# Patient Record
Sex: Female | Born: 1960
Health system: Southern US, Community
[De-identification: ages and names within clinical notes are randomized; demographics above are authoritative.]

## PROBLEM LIST (undated history)

## (undated) DIAGNOSIS — R109 Unspecified abdominal pain: Secondary | ICD-10-CM

## (undated) DIAGNOSIS — C801 Malignant (primary) neoplasm, unspecified: Secondary | ICD-10-CM

## (undated) DIAGNOSIS — Z9889 Other specified postprocedural states: Secondary | ICD-10-CM

## (undated) DIAGNOSIS — Z923 Personal history of irradiation: Secondary | ICD-10-CM

## (undated) DIAGNOSIS — Z9049 Acquired absence of other specified parts of digestive tract: Secondary | ICD-10-CM

## (undated) DIAGNOSIS — C50919 Malignant neoplasm of unspecified site of unspecified female breast: Secondary | ICD-10-CM

## (undated) DIAGNOSIS — C9210 Chronic myeloid leukemia, BCR/ABL-positive, not having achieved remission: Secondary | ICD-10-CM

## (undated) DIAGNOSIS — C189 Malignant neoplasm of colon, unspecified: Secondary | ICD-10-CM

## (undated) HISTORY — DX: Acquired absence of other specified parts of digestive tract: Z90.49

## (undated) HISTORY — DX: Chronic myeloid leukemia, BCR/ABL-positive, not having achieved remission: C92.10

## (undated) HISTORY — PX: SKIN CANCER EXCISION: SHX779

## (undated) HISTORY — DX: Malignant neoplasm of colon, unspecified: C18.9

## (undated) HISTORY — DX: Other specified postprocedural states: Z98.890

## (undated) HISTORY — DX: Malignant neoplasm of unspecified site of unspecified female breast: C50.919

---

## 1998-09-25 ENCOUNTER — Other Ambulatory Visit: Admission: RE | Admit: 1998-09-25 | Discharge: 1998-09-25 | Payer: Self-pay | Admitting: Gynecology

## 1998-11-08 ENCOUNTER — Encounter: Payer: Self-pay | Admitting: Gynecology

## 1998-11-08 ENCOUNTER — Ambulatory Visit (HOSPITAL_COMMUNITY): Admission: RE | Admit: 1998-11-08 | Discharge: 1998-11-08 | Payer: Self-pay | Admitting: Gynecology

## 1999-09-26 ENCOUNTER — Other Ambulatory Visit: Admission: RE | Admit: 1999-09-26 | Discharge: 1999-09-26 | Payer: Self-pay | Admitting: Gynecology

## 2000-04-14 ENCOUNTER — Other Ambulatory Visit: Admission: RE | Admit: 2000-04-14 | Discharge: 2000-04-14 | Payer: Self-pay | Admitting: Gynecology

## 2000-09-17 ENCOUNTER — Encounter: Admission: RE | Admit: 2000-09-17 | Discharge: 2000-12-16 | Payer: Self-pay | Admitting: Gynecology

## 2000-10-26 ENCOUNTER — Ambulatory Visit (HOSPITAL_COMMUNITY): Admission: RE | Admit: 2000-10-26 | Discharge: 2000-10-26 | Payer: Self-pay | Admitting: Gynecology

## 2000-10-26 ENCOUNTER — Encounter: Payer: Self-pay | Admitting: Gynecology

## 2000-10-29 ENCOUNTER — Inpatient Hospital Stay (HOSPITAL_COMMUNITY): Admission: AD | Admit: 2000-10-29 | Discharge: 2000-11-01 | Payer: Self-pay | Admitting: Gynecology

## 2000-11-02 ENCOUNTER — Encounter: Admission: RE | Admit: 2000-11-02 | Discharge: 2000-12-01 | Payer: Self-pay | Admitting: Gynecology

## 2000-12-13 ENCOUNTER — Other Ambulatory Visit: Admission: RE | Admit: 2000-12-13 | Discharge: 2000-12-13 | Payer: Self-pay | Admitting: Gynecology

## 2002-02-07 ENCOUNTER — Other Ambulatory Visit: Admission: RE | Admit: 2002-02-07 | Discharge: 2002-02-07 | Payer: Self-pay | Admitting: Gynecology

## 2003-02-01 ENCOUNTER — Other Ambulatory Visit: Admission: RE | Admit: 2003-02-01 | Discharge: 2003-02-01 | Payer: Self-pay | Admitting: Gynecology

## 2003-12-01 DIAGNOSIS — Z9889 Other specified postprocedural states: Secondary | ICD-10-CM

## 2003-12-01 HISTORY — PX: BREAST LUMPECTOMY: SHX2

## 2003-12-01 HISTORY — DX: Other specified postprocedural states: Z98.890

## 2004-01-30 ENCOUNTER — Ambulatory Visit (HOSPITAL_COMMUNITY): Admission: RE | Admit: 2004-01-30 | Discharge: 2004-01-30 | Payer: Self-pay | Admitting: Gynecology

## 2004-02-04 ENCOUNTER — Other Ambulatory Visit: Admission: RE | Admit: 2004-02-04 | Discharge: 2004-02-04 | Payer: Self-pay | Admitting: Gynecology

## 2004-03-03 ENCOUNTER — Encounter: Admission: RE | Admit: 2004-03-03 | Discharge: 2004-03-03 | Payer: Self-pay | Admitting: Gynecology

## 2004-09-19 ENCOUNTER — Encounter: Admission: RE | Admit: 2004-09-19 | Discharge: 2004-09-19 | Payer: Self-pay | Admitting: Gynecology

## 2004-09-19 ENCOUNTER — Encounter (INDEPENDENT_AMBULATORY_CARE_PROVIDER_SITE_OTHER): Payer: Self-pay | Admitting: *Deleted

## 2004-09-26 ENCOUNTER — Encounter: Admission: RE | Admit: 2004-09-26 | Discharge: 2004-09-26 | Payer: Self-pay | Admitting: General Surgery

## 2004-10-13 ENCOUNTER — Encounter: Admission: RE | Admit: 2004-10-13 | Discharge: 2004-10-13 | Payer: Self-pay | Admitting: General Surgery

## 2004-10-13 ENCOUNTER — Ambulatory Visit (HOSPITAL_COMMUNITY): Admission: RE | Admit: 2004-10-13 | Discharge: 2004-10-13 | Payer: Self-pay | Admitting: General Surgery

## 2004-10-13 ENCOUNTER — Encounter (INDEPENDENT_AMBULATORY_CARE_PROVIDER_SITE_OTHER): Payer: Self-pay | Admitting: *Deleted

## 2004-10-13 ENCOUNTER — Ambulatory Visit (HOSPITAL_BASED_OUTPATIENT_CLINIC_OR_DEPARTMENT_OTHER): Admission: RE | Admit: 2004-10-13 | Discharge: 2004-10-13 | Payer: Self-pay | Admitting: General Surgery

## 2004-10-13 DIAGNOSIS — C50919 Malignant neoplasm of unspecified site of unspecified female breast: Secondary | ICD-10-CM

## 2004-10-13 HISTORY — DX: Malignant neoplasm of unspecified site of unspecified female breast: C50.919

## 2004-10-29 ENCOUNTER — Ambulatory Visit: Payer: Self-pay | Admitting: Oncology

## 2004-10-30 HISTORY — PX: RE-EXCISION OF BREAST LUMPECTOMY: SHX6048

## 2004-11-03 ENCOUNTER — Ambulatory Visit (HOSPITAL_BASED_OUTPATIENT_CLINIC_OR_DEPARTMENT_OTHER): Admission: RE | Admit: 2004-11-03 | Discharge: 2004-11-03 | Payer: Self-pay | Admitting: General Surgery

## 2004-11-03 ENCOUNTER — Encounter (INDEPENDENT_AMBULATORY_CARE_PROVIDER_SITE_OTHER): Payer: Self-pay | Admitting: Specialist

## 2004-11-11 ENCOUNTER — Ambulatory Visit: Admission: RE | Admit: 2004-11-11 | Discharge: 2005-01-28 | Payer: Self-pay | Admitting: Radiation Oncology

## 2004-11-27 ENCOUNTER — Ambulatory Visit (HOSPITAL_COMMUNITY): Admission: RE | Admit: 2004-11-27 | Discharge: 2004-11-27 | Payer: Self-pay | Admitting: General Surgery

## 2004-11-27 ENCOUNTER — Ambulatory Visit (HOSPITAL_BASED_OUTPATIENT_CLINIC_OR_DEPARTMENT_OTHER): Admission: RE | Admit: 2004-11-27 | Discharge: 2004-11-27 | Payer: Self-pay | Admitting: General Surgery

## 2004-11-27 ENCOUNTER — Encounter (INDEPENDENT_AMBULATORY_CARE_PROVIDER_SITE_OTHER): Payer: Self-pay | Admitting: *Deleted

## 2005-01-14 ENCOUNTER — Ambulatory Visit: Payer: Self-pay | Admitting: Oncology

## 2005-02-06 ENCOUNTER — Ambulatory Visit (HOSPITAL_BASED_OUTPATIENT_CLINIC_OR_DEPARTMENT_OTHER): Admission: RE | Admit: 2005-02-06 | Discharge: 2005-02-06 | Payer: Self-pay | Admitting: Gynecology

## 2005-02-19 ENCOUNTER — Ambulatory Visit: Admission: RE | Admit: 2005-02-19 | Discharge: 2005-02-19 | Payer: Self-pay | Admitting: Radiation Oncology

## 2005-03-05 ENCOUNTER — Other Ambulatory Visit: Admission: RE | Admit: 2005-03-05 | Discharge: 2005-03-05 | Payer: Self-pay | Admitting: Gynecology

## 2005-03-17 ENCOUNTER — Ambulatory Visit: Payer: Self-pay | Admitting: Oncology

## 2005-05-06 ENCOUNTER — Ambulatory Visit: Payer: Self-pay | Admitting: Oncology

## 2005-07-22 ENCOUNTER — Encounter: Admission: RE | Admit: 2005-07-22 | Discharge: 2005-07-22 | Payer: Self-pay | Admitting: Radiation Oncology

## 2005-09-28 ENCOUNTER — Encounter: Admission: RE | Admit: 2005-09-28 | Discharge: 2005-09-28 | Payer: Self-pay | Admitting: Radiation Oncology

## 2005-11-17 ENCOUNTER — Ambulatory Visit: Payer: Self-pay | Admitting: Oncology

## 2005-11-30 DIAGNOSIS — Z9049 Acquired absence of other specified parts of digestive tract: Secondary | ICD-10-CM

## 2005-11-30 HISTORY — DX: Acquired absence of other specified parts of digestive tract: Z90.49

## 2006-03-16 ENCOUNTER — Other Ambulatory Visit: Admission: RE | Admit: 2006-03-16 | Discharge: 2006-03-16 | Payer: Self-pay | Admitting: Gynecology

## 2006-05-17 ENCOUNTER — Ambulatory Visit: Payer: Self-pay | Admitting: Oncology

## 2006-05-19 LAB — COMPREHENSIVE METABOLIC PANEL
ALT: 12 U/L (ref 0–40)
BUN: 17 mg/dL (ref 6–23)
CO2: 26 mEq/L (ref 19–32)
Calcium: 8.7 mg/dL (ref 8.4–10.5)
Chloride: 106 mEq/L (ref 96–112)
Creatinine, Ser: 0.7 mg/dL (ref 0.40–1.20)
Glucose, Bld: 90 mg/dL (ref 70–99)

## 2006-05-19 LAB — CBC WITH DIFFERENTIAL/PLATELET
BASO%: 0.4 % (ref 0.0–2.0)
Basophils Absolute: 0 10*3/uL (ref 0.0–0.1)
Eosinophils Absolute: 0.1 10*3/uL (ref 0.0–0.5)
HCT: 35.9 % (ref 34.8–46.6)
HGB: 12.3 g/dL (ref 11.6–15.9)
MONO#: 0.5 10*3/uL (ref 0.1–0.9)
NEUT#: 4.2 10*3/uL (ref 1.5–6.5)
NEUT%: 64.4 % (ref 39.6–76.8)
Platelets: 263 10*3/uL (ref 145–400)
WBC: 6.6 10*3/uL (ref 3.9–10.0)
lymph#: 1.7 10*3/uL (ref 0.9–3.3)

## 2006-05-19 LAB — LACTATE DEHYDROGENASE: LDH: 166 U/L (ref 94–250)

## 2006-07-19 ENCOUNTER — Encounter (INDEPENDENT_AMBULATORY_CARE_PROVIDER_SITE_OTHER): Payer: Self-pay | Admitting: Specialist

## 2006-07-19 ENCOUNTER — Inpatient Hospital Stay (HOSPITAL_COMMUNITY): Admission: RE | Admit: 2006-07-19 | Discharge: 2006-07-26 | Payer: Self-pay | Admitting: General Surgery

## 2006-07-19 DIAGNOSIS — C189 Malignant neoplasm of colon, unspecified: Secondary | ICD-10-CM

## 2006-07-19 HISTORY — PX: LOW ANTERIOR BOWEL RESECTION: SUR1240

## 2006-07-19 HISTORY — DX: Malignant neoplasm of colon, unspecified: C18.9

## 2006-08-04 ENCOUNTER — Emergency Department (HOSPITAL_COMMUNITY): Admission: EM | Admit: 2006-08-04 | Discharge: 2006-08-04 | Payer: Self-pay | Admitting: Emergency Medicine

## 2006-08-05 ENCOUNTER — Ambulatory Visit: Payer: Self-pay | Admitting: Oncology

## 2006-08-17 LAB — CEA: CEA: 2.7 ng/mL (ref 0.0–5.0)

## 2006-08-17 LAB — CBC WITH DIFFERENTIAL/PLATELET
Basophils Absolute: 0 10*3/uL (ref 0.0–0.1)
Eosinophils Absolute: 0.1 10*3/uL (ref 0.0–0.5)
HGB: 11.2 g/dL — ABNORMAL LOW (ref 11.6–15.9)
LYMPH%: 19.8 % (ref 14.0–48.0)
MCV: 87.2 fL (ref 81.0–101.0)
MONO#: 0.4 10*3/uL (ref 0.1–0.9)
MONO%: 6.1 % (ref 0.0–13.0)
NEUT#: 4.5 10*3/uL (ref 1.5–6.5)
Platelets: 291 10*3/uL (ref 145–400)

## 2006-08-17 LAB — LACTATE DEHYDROGENASE: LDH: 128 U/L (ref 94–250)

## 2006-08-17 LAB — COMPREHENSIVE METABOLIC PANEL
CO2: 24 mEq/L (ref 19–32)
Calcium: 9.1 mg/dL (ref 8.4–10.5)
Chloride: 106 mEq/L (ref 96–112)
Creatinine, Ser: 0.67 mg/dL (ref 0.40–1.20)
Glucose, Bld: 105 mg/dL — ABNORMAL HIGH (ref 70–99)
Total Bilirubin: 0.5 mg/dL (ref 0.3–1.2)

## 2006-08-20 ENCOUNTER — Ambulatory Visit: Admission: RE | Admit: 2006-08-20 | Discharge: 2006-11-12 | Payer: Self-pay | Admitting: Radiation Oncology

## 2006-08-24 ENCOUNTER — Ambulatory Visit (HOSPITAL_COMMUNITY): Admission: RE | Admit: 2006-08-24 | Discharge: 2006-08-24 | Payer: Self-pay | Admitting: Oncology

## 2006-08-27 LAB — URINALYSIS, MICROSCOPIC - CHCC
Bilirubin (Urine): NEGATIVE
Ketones: NEGATIVE mg/dL
Protein: NEGATIVE mg/dL
pH: 6.5 (ref 4.6–8.0)

## 2006-08-29 LAB — URINE CULTURE

## 2006-09-13 ENCOUNTER — Ambulatory Visit: Payer: Self-pay | Admitting: Oncology

## 2006-09-13 LAB — COMPREHENSIVE METABOLIC PANEL
ALT: 8 U/L (ref 0–40)
AST: 12 U/L (ref 0–37)
Albumin: 3.8 g/dL (ref 3.5–5.2)
Calcium: 8.4 mg/dL (ref 8.4–10.5)
Chloride: 105 mEq/L (ref 96–112)
Potassium: 3.6 mEq/L (ref 3.5–5.3)
Sodium: 142 mEq/L (ref 135–145)
Total Protein: 6.3 g/dL (ref 6.0–8.3)

## 2006-09-13 LAB — CBC WITH DIFFERENTIAL/PLATELET
BASO%: 0.4 % (ref 0.0–2.0)
Basophils Absolute: 0 10*3/uL (ref 0.0–0.1)
EOS%: 3.5 % (ref 0.0–7.0)
HGB: 10.5 g/dL — ABNORMAL LOW (ref 11.6–15.9)
MCH: 29.5 pg (ref 26.0–34.0)
RDW: 15.7 % — ABNORMAL HIGH (ref 11.3–14.5)
lymph#: 0.9 10*3/uL (ref 0.9–3.3)

## 2006-10-05 LAB — CBC WITH DIFFERENTIAL/PLATELET
BASO%: 0.2 % (ref 0.0–2.0)
EOS%: 10.4 % — ABNORMAL HIGH (ref 0.0–7.0)
HGB: 11.2 g/dL — ABNORMAL LOW (ref 11.6–15.9)
MCH: 30.5 pg (ref 26.0–34.0)
MCHC: 34 g/dL (ref 32.0–36.0)
RDW: 18.1 % — ABNORMAL HIGH (ref 11.3–14.5)
WBC: 3.6 10*3/uL — ABNORMAL LOW (ref 3.9–10.0)
lymph#: 0.4 10*3/uL — ABNORMAL LOW (ref 0.9–3.3)

## 2006-10-05 LAB — COMPREHENSIVE METABOLIC PANEL
ALT: 10 U/L (ref 0–35)
AST: 12 U/L (ref 0–37)
Albumin: 3.8 g/dL (ref 3.5–5.2)
Calcium: 8.6 mg/dL (ref 8.4–10.5)
Chloride: 106 mEq/L (ref 96–112)
Potassium: 3.8 mEq/L (ref 3.5–5.3)
Total Protein: 6.2 g/dL (ref 6.0–8.3)

## 2006-10-07 ENCOUNTER — Encounter: Admission: RE | Admit: 2006-10-07 | Discharge: 2006-10-07 | Payer: Self-pay | Admitting: Oncology

## 2006-10-12 LAB — COMPREHENSIVE METABOLIC PANEL
ALT: 9 U/L (ref 0–35)
AST: 11 U/L (ref 0–37)
Calcium: 8.6 mg/dL (ref 8.4–10.5)
Chloride: 104 mEq/L (ref 96–112)
Creatinine, Ser: 0.63 mg/dL (ref 0.40–1.20)
Potassium: 3.7 mEq/L (ref 3.5–5.3)

## 2006-10-12 LAB — CBC WITH DIFFERENTIAL/PLATELET
BASO%: 0.1 % (ref 0.0–2.0)
EOS%: 10.1 % — ABNORMAL HIGH (ref 0.0–7.0)
MCH: 31 pg (ref 26.0–34.0)
MCHC: 34.5 g/dL (ref 32.0–36.0)
NEUT%: 70.5 % (ref 39.6–76.8)
RDW: 18.7 % — ABNORMAL HIGH (ref 11.3–14.5)
lymph#: 0.4 10*3/uL — ABNORMAL LOW (ref 0.9–3.3)

## 2006-10-27 LAB — COMPREHENSIVE METABOLIC PANEL
AST: 16 U/L (ref 0–37)
Albumin: 3.6 g/dL (ref 3.5–5.2)
BUN: 13 mg/dL (ref 6–23)
Calcium: 8.4 mg/dL (ref 8.4–10.5)
Chloride: 106 mEq/L (ref 96–112)
Glucose, Bld: 138 mg/dL — ABNORMAL HIGH (ref 70–99)
Potassium: 3.5 mEq/L (ref 3.5–5.3)

## 2006-10-27 LAB — CBC WITH DIFFERENTIAL/PLATELET
BASO%: 0.3 % (ref 0.0–2.0)
EOS%: 6.9 % (ref 0.0–7.0)
HCT: 30.7 % — ABNORMAL LOW (ref 34.8–46.6)
LYMPH%: 8.2 % — ABNORMAL LOW (ref 14.0–48.0)
MCH: 30.8 pg (ref 26.0–34.0)
MCHC: 34.1 g/dL (ref 32.0–36.0)
MONO%: 8.8 % (ref 0.0–13.0)
NEUT%: 75.8 % (ref 39.6–76.8)
lymph#: 0.2 10*3/uL — ABNORMAL LOW (ref 0.9–3.3)

## 2006-11-27 ENCOUNTER — Ambulatory Visit: Payer: Self-pay | Admitting: Oncology

## 2006-12-02 LAB — URINALYSIS, MICROSCOPIC - CHCC
Bilirubin (Urine): NEGATIVE
Nitrite: POSITIVE
Specific Gravity, Urine: 1.015 (ref 1.003–1.035)

## 2006-12-02 LAB — CBC WITH DIFFERENTIAL/PLATELET
BASO%: 1.5 % (ref 0.0–2.0)
Basophils Absolute: 0.1 10*3/uL (ref 0.0–0.1)
EOS%: 11.9 % — ABNORMAL HIGH (ref 0.0–7.0)
HGB: 12.2 g/dL (ref 11.6–15.9)
MCH: 30 pg (ref 26.0–34.0)
RDW: 12.7 % (ref 11.3–14.5)
WBC: 4.8 10*3/uL (ref 3.9–10.0)
lymph#: 0.6 10*3/uL — ABNORMAL LOW (ref 0.9–3.3)

## 2006-12-02 LAB — COMPREHENSIVE METABOLIC PANEL
ALT: 10 U/L (ref 0–35)
AST: 13 U/L (ref 0–37)
Albumin: 4.1 g/dL (ref 3.5–5.2)
BUN: 13 mg/dL (ref 6–23)
Calcium: 9.1 mg/dL (ref 8.4–10.5)
Chloride: 106 mEq/L (ref 96–112)
Potassium: 3.6 mEq/L (ref 3.5–5.3)

## 2006-12-05 LAB — URINE CULTURE

## 2006-12-09 LAB — CBC WITH DIFFERENTIAL/PLATELET
Basophils Absolute: 0 10*3/uL (ref 0.0–0.1)
Eosinophils Absolute: 0.3 10*3/uL (ref 0.0–0.5)
HGB: 10.9 g/dL — ABNORMAL LOW (ref 11.6–15.9)
NEUT#: 2.6 10*3/uL (ref 1.5–6.5)
RDW: 11.8 % (ref 11.3–14.5)
lymph#: 0.5 10*3/uL — ABNORMAL LOW (ref 0.9–3.3)

## 2006-12-23 LAB — COMPREHENSIVE METABOLIC PANEL
Albumin: 3.7 g/dL (ref 3.5–5.2)
Alkaline Phosphatase: 52 U/L (ref 39–117)
BUN: 14 mg/dL (ref 6–23)
CO2: 23 mEq/L (ref 19–32)
Calcium: 8.5 mg/dL (ref 8.4–10.5)
Glucose, Bld: 147 mg/dL — ABNORMAL HIGH (ref 70–99)
Potassium: 4.2 mEq/L (ref 3.5–5.3)
Sodium: 142 mEq/L (ref 135–145)
Total Protein: 6.2 g/dL (ref 6.0–8.3)

## 2006-12-23 LAB — URINALYSIS, MICROSCOPIC - CHCC
Bilirubin (Urine): NEGATIVE
Blood: NEGATIVE
Nitrite: NEGATIVE
pH: 6.5 (ref 4.6–8.0)

## 2006-12-23 LAB — CBC WITH DIFFERENTIAL/PLATELET
Basophils Absolute: 0.1 10*3/uL (ref 0.0–0.1)
Eosinophils Absolute: 0.2 10*3/uL (ref 0.0–0.5)
HGB: 11.5 g/dL — ABNORMAL LOW (ref 11.6–15.9)
LYMPH%: 16.7 % (ref 14.0–48.0)
MCV: 92.8 fL (ref 81.0–101.0)
MONO%: 18.1 % — ABNORMAL HIGH (ref 0.0–13.0)
NEUT#: 1.6 10*3/uL (ref 1.5–6.5)
Platelets: 222 10*3/uL (ref 145–400)

## 2006-12-30 LAB — CBC WITH DIFFERENTIAL/PLATELET
Basophils Absolute: 0 10*3/uL (ref 0.0–0.1)
Eosinophils Absolute: 0.3 10*3/uL (ref 0.0–0.5)
HGB: 10.7 g/dL — ABNORMAL LOW (ref 11.6–15.9)
MONO#: 0.5 10*3/uL (ref 0.1–0.9)
MONO%: 12.1 % (ref 0.0–13.0)
NEUT#: 2.8 10*3/uL (ref 1.5–6.5)
RBC: 3.36 10*6/uL — ABNORMAL LOW (ref 3.70–5.32)
RDW: 14.3 % (ref 11.3–14.5)
WBC: 4.2 10*3/uL (ref 3.9–10.0)
lymph#: 0.7 10*3/uL — ABNORMAL LOW (ref 0.9–3.3)

## 2007-01-13 ENCOUNTER — Ambulatory Visit: Payer: Self-pay | Admitting: Oncology

## 2007-01-13 LAB — CBC WITH DIFFERENTIAL/PLATELET
Basophils Absolute: 0.1 10*3/uL (ref 0.0–0.1)
Eosinophils Absolute: 0.2 10*3/uL (ref 0.0–0.5)
HGB: 12.1 g/dL (ref 11.6–15.9)
LYMPH%: 13.7 % — ABNORMAL LOW (ref 14.0–48.0)
MCV: 92.9 fL (ref 81.0–101.0)
MONO#: 0.5 10*3/uL (ref 0.1–0.9)
MONO%: 14.7 % — ABNORMAL HIGH (ref 0.0–13.0)
NEUT#: 2.3 10*3/uL (ref 1.5–6.5)
Platelets: 226 10*3/uL (ref 145–400)
RBC: 3.79 10*6/uL (ref 3.70–5.32)
WBC: 3.5 10*3/uL — ABNORMAL LOW (ref 3.9–10.0)

## 2007-01-13 LAB — URINALYSIS, MICROSCOPIC - CHCC
Bilirubin (Urine): NEGATIVE
Glucose: NEGATIVE g/dL
Ketones: NEGATIVE mg/dL

## 2007-01-13 LAB — COMPREHENSIVE METABOLIC PANEL
Albumin: 3.8 g/dL (ref 3.5–5.2)
BUN: 13 mg/dL (ref 6–23)
CO2: 25 mEq/L (ref 19–32)
Glucose, Bld: 105 mg/dL — ABNORMAL HIGH (ref 70–99)
Potassium: 4 mEq/L (ref 3.5–5.3)
Sodium: 142 mEq/L (ref 135–145)
Total Bilirubin: 0.5 mg/dL (ref 0.3–1.2)
Total Protein: 6.4 g/dL (ref 6.0–8.3)

## 2007-01-20 LAB — COMPREHENSIVE METABOLIC PANEL
Albumin: 3.8 g/dL (ref 3.5–5.2)
Alkaline Phosphatase: 55 U/L (ref 39–117)
Calcium: 8.7 mg/dL (ref 8.4–10.5)
Chloride: 106 mEq/L (ref 96–112)
Glucose, Bld: 122 mg/dL — ABNORMAL HIGH (ref 70–99)
Potassium: 3.5 mEq/L (ref 3.5–5.3)
Sodium: 143 mEq/L (ref 135–145)
Total Protein: 6.6 g/dL (ref 6.0–8.3)

## 2007-01-20 LAB — CBC WITH DIFFERENTIAL/PLATELET
Basophils Absolute: 0 10*3/uL (ref 0.0–0.1)
Eosinophils Absolute: 0.2 10*3/uL (ref 0.0–0.5)
HGB: 11.6 g/dL (ref 11.6–15.9)
MCV: 91.8 fL (ref 81.0–101.0)
MONO#: 0.5 10*3/uL (ref 0.1–0.9)
MONO%: 12.8 % (ref 0.0–13.0)
NEUT#: 2.2 10*3/uL (ref 1.5–6.5)
RBC: 3.64 10*6/uL — ABNORMAL LOW (ref 3.70–5.32)
RDW: 17.3 % — ABNORMAL HIGH (ref 11.3–14.5)
WBC: 3.5 10*3/uL — ABNORMAL LOW (ref 3.9–10.0)
lymph#: 0.7 10*3/uL — ABNORMAL LOW (ref 0.9–3.3)

## 2007-02-03 LAB — CBC WITH DIFFERENTIAL/PLATELET
BASO%: 1.9 % (ref 0.0–2.0)
LYMPH%: 16 % (ref 14.0–48.0)
MCHC: 34.3 g/dL (ref 32.0–36.0)
MCV: 93.4 fL (ref 81.0–101.0)
MONO#: 0.5 10*3/uL (ref 0.1–0.9)
MONO%: 18 % — ABNORMAL HIGH (ref 0.0–13.0)
Platelets: 207 10*3/uL (ref 145–400)
RBC: 3.57 10*6/uL — ABNORMAL LOW (ref 3.70–5.32)
RDW: 17.3 % — ABNORMAL HIGH (ref 11.3–14.5)
WBC: 3 10*3/uL — ABNORMAL LOW (ref 3.9–10.0)

## 2007-02-03 LAB — COMPREHENSIVE METABOLIC PANEL
ALT: 11 U/L (ref 0–35)
Alkaline Phosphatase: 54 U/L (ref 39–117)
Sodium: 142 mEq/L (ref 135–145)
Total Bilirubin: 0.4 mg/dL (ref 0.3–1.2)
Total Protein: 5.7 g/dL — ABNORMAL LOW (ref 6.0–8.3)

## 2007-02-10 LAB — CBC WITH DIFFERENTIAL/PLATELET
BASO%: 0 % (ref 0.0–2.0)
EOS%: 2.3 % (ref 0.0–7.0)
HCT: 34.1 % — ABNORMAL LOW (ref 34.8–46.6)
LYMPH%: 14 % (ref 14.0–48.0)
MCH: 32.5 pg (ref 26.0–34.0)
MCHC: 34.8 g/dL (ref 32.0–36.0)
NEUT%: 70.6 % (ref 39.6–76.8)
Platelets: 183 10*3/uL (ref 145–400)

## 2007-02-23 LAB — COMPREHENSIVE METABOLIC PANEL
ALT: 11 U/L (ref 0–35)
AST: 16 U/L (ref 0–37)
Alkaline Phosphatase: 66 U/L (ref 39–117)
Calcium: 8.9 mg/dL (ref 8.4–10.5)
Chloride: 106 mEq/L (ref 96–112)
Creatinine, Ser: 0.52 mg/dL (ref 0.40–1.20)

## 2007-02-23 LAB — CBC WITH DIFFERENTIAL/PLATELET
BASO%: 1.6 % (ref 0.0–2.0)
EOS%: 1.5 % (ref 0.0–7.0)
HCT: 32.5 % — ABNORMAL LOW (ref 34.8–46.6)
MCH: 33.5 pg (ref 26.0–34.0)
MCHC: 35.9 g/dL (ref 32.0–36.0)
NEUT%: 66.9 % (ref 39.6–76.8)
RDW: 17.8 % — ABNORMAL HIGH (ref 11.3–14.5)
lymph#: 0.5 10*3/uL — ABNORMAL LOW (ref 0.9–3.3)

## 2007-03-14 ENCOUNTER — Ambulatory Visit: Payer: Self-pay | Admitting: Oncology

## 2007-03-17 LAB — CBC WITH DIFFERENTIAL/PLATELET
BASO%: 3.1 % — ABNORMAL HIGH (ref 0.0–2.0)
EOS%: 3.1 % (ref 0.0–7.0)
HCT: 32.4 % — ABNORMAL LOW (ref 34.8–46.6)
LYMPH%: 18.4 % (ref 14.0–48.0)
MCH: 33.7 pg (ref 26.0–34.0)
MCHC: 34.9 g/dL (ref 32.0–36.0)
MCV: 96.5 fL (ref 81.0–101.0)
MONO%: 16.4 % — ABNORMAL HIGH (ref 0.0–13.0)
NEUT%: 58.9 % (ref 39.6–76.8)
Platelets: 197 10*3/uL (ref 145–400)
lymph#: 0.6 10*3/uL — ABNORMAL LOW (ref 0.9–3.3)

## 2007-03-17 LAB — COMPREHENSIVE METABOLIC PANEL
ALT: 15 U/L (ref 0–35)
AST: 24 U/L (ref 0–37)
Alkaline Phosphatase: 69 U/L (ref 39–117)
BUN: 12 mg/dL (ref 6–23)
Creatinine, Ser: 0.55 mg/dL (ref 0.40–1.20)
Total Bilirubin: 0.6 mg/dL (ref 0.3–1.2)

## 2007-03-25 LAB — CBC WITH DIFFERENTIAL/PLATELET
Basophils Absolute: 0.1 10*3/uL (ref 0.0–0.1)
EOS%: 2.8 % (ref 0.0–7.0)
Eosinophils Absolute: 0.1 10*3/uL (ref 0.0–0.5)
HCT: 27.8 % — ABNORMAL LOW (ref 34.8–46.6)
HGB: 9.7 g/dL — ABNORMAL LOW (ref 11.6–15.9)
LYMPH%: 14.1 % (ref 14.0–48.0)
MCH: 33.1 pg (ref 26.0–34.0)
MCV: 95.3 fL (ref 81.0–101.0)
MONO%: 23.1 % — ABNORMAL HIGH (ref 0.0–13.0)
NEUT#: 1.8 10*3/uL (ref 1.5–6.5)
NEUT%: 58.1 % (ref 39.6–76.8)
Platelets: 138 10*3/uL — ABNORMAL LOW (ref 145–400)

## 2007-04-08 LAB — URINALYSIS, MICROSCOPIC - CHCC
Glucose: NEGATIVE g/dL
Nitrite: POSITIVE

## 2007-04-08 LAB — CBC WITH DIFFERENTIAL/PLATELET
EOS%: 2 % (ref 0.0–7.0)
Eosinophils Absolute: 0.1 10*3/uL (ref 0.0–0.5)
LYMPH%: 20.9 % (ref 14.0–48.0)
MCH: 33.9 pg (ref 26.0–34.0)
MCHC: 34.8 g/dL (ref 32.0–36.0)
MCV: 97.6 fL (ref 81.0–101.0)
MONO%: 19.7 % — ABNORMAL HIGH (ref 0.0–13.0)
NEUT#: 1.5 10*3/uL (ref 1.5–6.5)
Platelets: 223 10*3/uL (ref 145–400)
RBC: 3.03 10*6/uL — ABNORMAL LOW (ref 3.70–5.32)

## 2007-07-01 ENCOUNTER — Ambulatory Visit (HOSPITAL_COMMUNITY): Admission: RE | Admit: 2007-07-01 | Discharge: 2007-07-01 | Payer: Self-pay | Admitting: Oncology

## 2007-07-06 ENCOUNTER — Ambulatory Visit: Payer: Self-pay | Admitting: Oncology

## 2007-07-08 LAB — COMPREHENSIVE METABOLIC PANEL
ALT: 13 U/L (ref 0–35)
CO2: 25 mEq/L (ref 19–32)
Creatinine, Ser: 0.59 mg/dL (ref 0.40–1.20)
Total Bilirubin: 0.2 mg/dL — ABNORMAL LOW (ref 0.3–1.2)

## 2007-07-08 LAB — CBC WITH DIFFERENTIAL/PLATELET
BASO%: 0.4 % (ref 0.0–2.0)
Basophils Absolute: 0 10*3/uL (ref 0.0–0.1)
EOS%: 1.4 % (ref 0.0–7.0)
Eosinophils Absolute: 0 10*3/uL (ref 0.0–0.5)
HCT: 31.5 % — ABNORMAL LOW (ref 34.8–46.6)
HGB: 11.1 g/dL — ABNORMAL LOW (ref 11.6–15.9)
LYMPH%: 23.4 % (ref 14.0–48.0)
MCH: 30.6 pg (ref 26.0–34.0)
MCHC: 35.3 g/dL (ref 32.0–36.0)
MCV: 86.9 fL (ref 81.0–101.0)
MONO#: 0.3 10*3/uL (ref 0.1–0.9)
MONO%: 9.6 % (ref 0.0–13.0)
NEUT#: 2.3 10*3/uL (ref 1.5–6.5)
NEUT%: 65.2 % (ref 39.6–76.8)
Platelets: 223 10*3/uL (ref 145–400)
RBC: 3.63 10*6/uL — ABNORMAL LOW (ref 3.70–5.32)
RDW: 13.1 % (ref 11.3–14.5)
WBC: 3.5 10*3/uL — ABNORMAL LOW (ref 3.9–10.0)
lymph#: 0.8 10*3/uL — ABNORMAL LOW (ref 0.9–3.3)

## 2007-07-08 LAB — LACTATE DEHYDROGENASE: LDH: 159 U/L (ref 94–250)

## 2007-07-08 LAB — CEA: CEA: 1.5 ng/mL (ref 0.0–5.0)

## 2007-10-10 ENCOUNTER — Encounter: Admission: RE | Admit: 2007-10-10 | Discharge: 2007-10-10 | Payer: Self-pay | Admitting: Oncology

## 2007-10-13 ENCOUNTER — Encounter: Admission: RE | Admit: 2007-10-13 | Discharge: 2007-10-13 | Payer: Self-pay | Admitting: Oncology

## 2007-10-13 ENCOUNTER — Other Ambulatory Visit: Admission: RE | Admit: 2007-10-13 | Discharge: 2007-10-13 | Payer: Self-pay | Admitting: Obstetrics & Gynecology

## 2007-10-19 ENCOUNTER — Ambulatory Visit: Payer: Self-pay | Admitting: Oncology

## 2007-10-31 LAB — COMPREHENSIVE METABOLIC PANEL
Albumin: 4 g/dL (ref 3.5–5.2)
CO2: 25 mEq/L (ref 19–32)
Glucose, Bld: 126 mg/dL — ABNORMAL HIGH (ref 70–99)
Potassium: 3.3 mEq/L — ABNORMAL LOW (ref 3.5–5.3)
Sodium: 143 mEq/L (ref 135–145)
Total Protein: 6.7 g/dL (ref 6.0–8.3)

## 2007-10-31 LAB — CBC WITH DIFFERENTIAL/PLATELET
Eosinophils Absolute: 0 10*3/uL (ref 0.0–0.5)
MONO#: 0.3 10*3/uL (ref 0.1–0.9)
NEUT#: 2.7 10*3/uL (ref 1.5–6.5)
RBC: 3.78 10*6/uL (ref 3.70–5.32)
RDW: 13.6 % (ref 11.3–14.5)
WBC: 4 10*3/uL (ref 3.9–10.0)

## 2007-10-31 LAB — LACTATE DEHYDROGENASE: LDH: 173 U/L (ref 94–250)

## 2008-01-17 ENCOUNTER — Ambulatory Visit: Admission: RE | Admit: 2008-01-17 | Discharge: 2008-01-17 | Payer: Self-pay | Admitting: Gynecologic Oncology

## 2008-02-24 ENCOUNTER — Ambulatory Visit: Payer: Self-pay | Admitting: Oncology

## 2008-03-02 ENCOUNTER — Ambulatory Visit (HOSPITAL_COMMUNITY): Admission: RE | Admit: 2008-03-02 | Discharge: 2008-03-02 | Payer: Self-pay | Admitting: Oncology

## 2008-03-02 ENCOUNTER — Encounter: Admission: RE | Admit: 2008-03-02 | Discharge: 2008-03-02 | Payer: Self-pay | Admitting: Oncology

## 2008-03-09 IMAGING — CT CT PELVIS W/ CM
2 of 5 series · 17 of 46 positions shown, 19 images · IV contrast (APPLIED)
Comparison: None.

CLINICAL DATA: 45 year-old status post partial colectomy for rectal cancer.
 ABDOMEN CT WITH CONTRAST:
TECHNIQUE: Multidetector CT imaging of the abdomen was performed following the standard protocol during bolus administration of intravenous contrast.
 Contrast:  100 cc Omnipaque 300
TECHNIQUE: Multidetector CT imaging of the pelvis was performed following the standard protocol during bolus administration of intravenous contrast.

[Series 2: abd/pelv with 5.0 b31f st · axial · 0.69mm/px · z∈[-456,-76]mm · 14 of 86 slices shown, 16 images]
[im 5/86  soft-tissue]
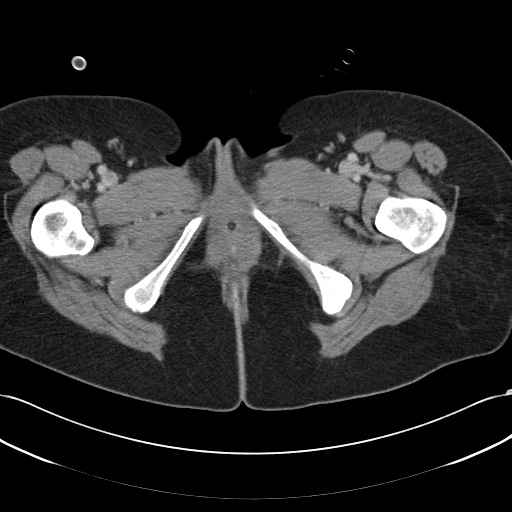
[im 5/86  bone]
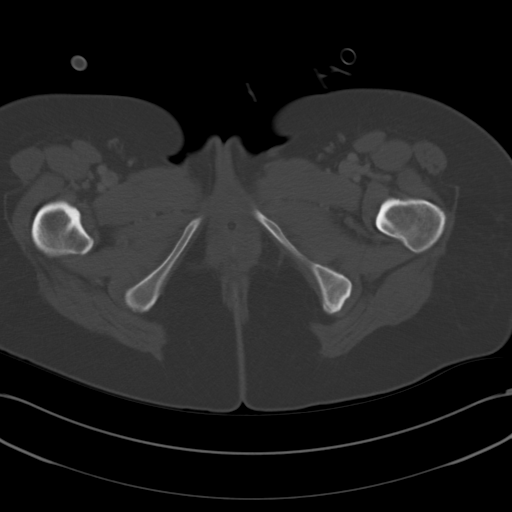
[im 10/86  soft-tissue]
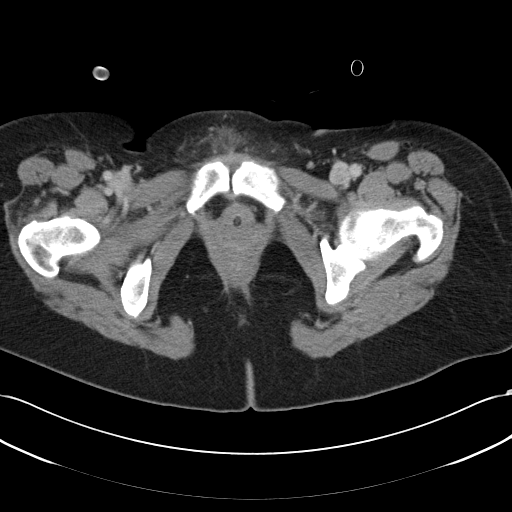
[im 19/86  soft-tissue]
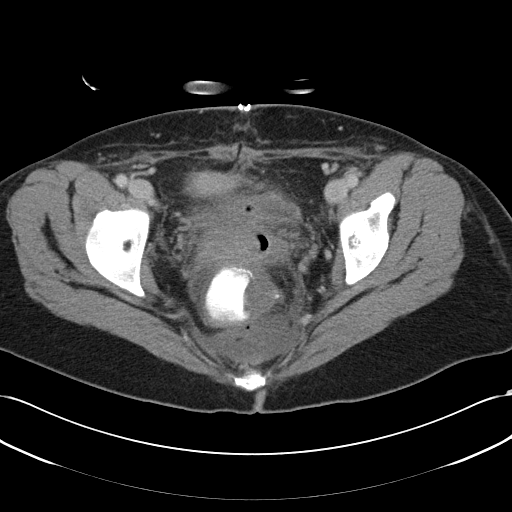
[im 24/86  soft-tissue]
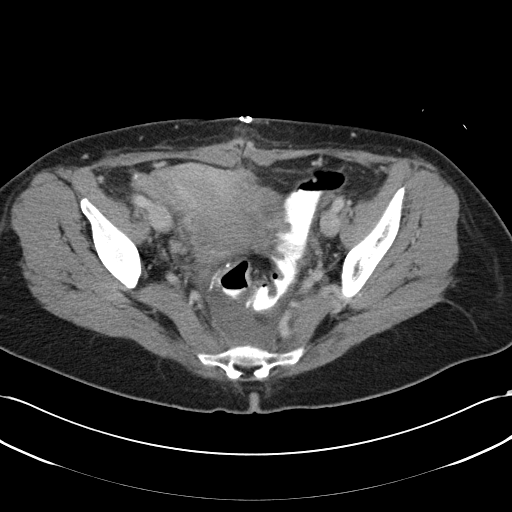
[im 29/86  soft-tissue]
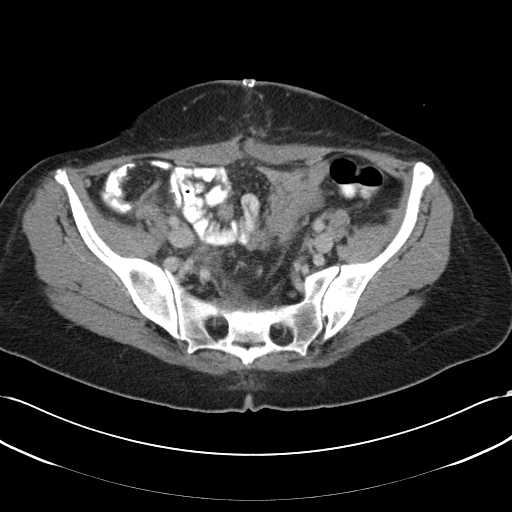
[im 34/86  soft-tissue]
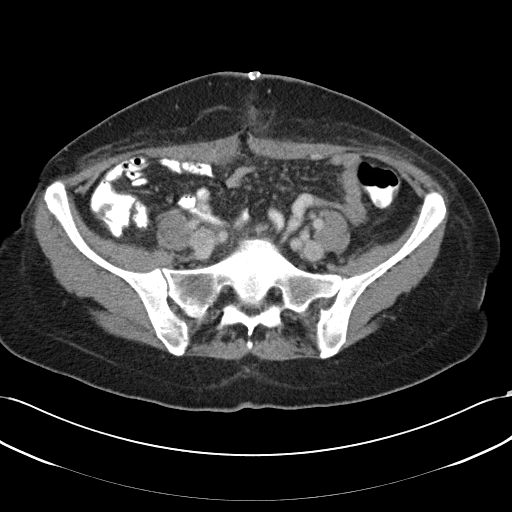
[im 38/86  soft-tissue]
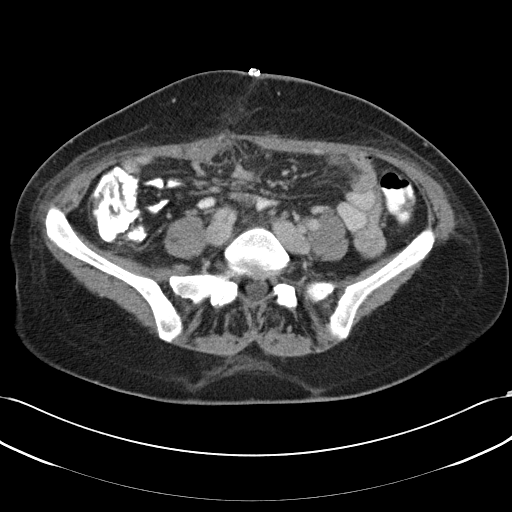
[im 48/86  soft-tissue]
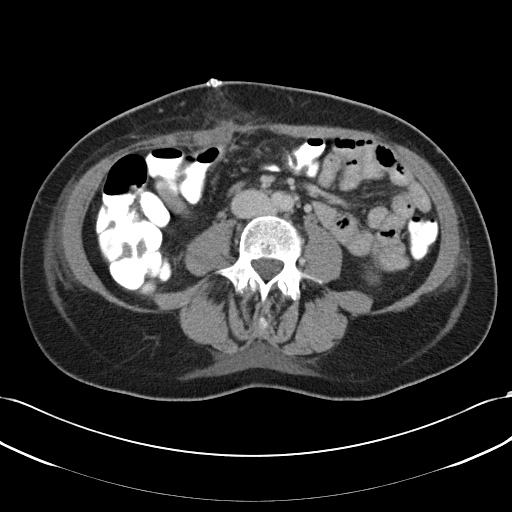
[im 52/86  soft-tissue]
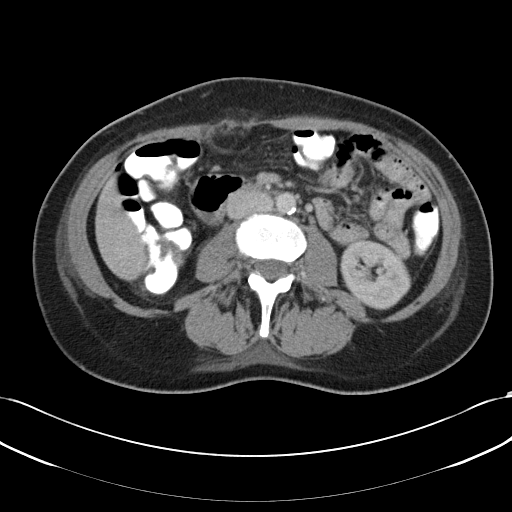
[im 52/86  bone]
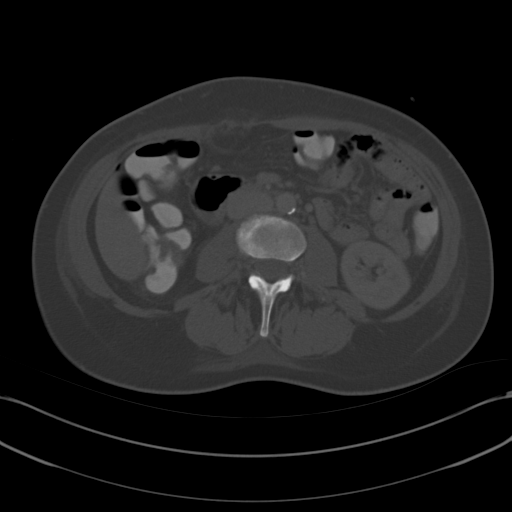
[im 57/86  soft-tissue]
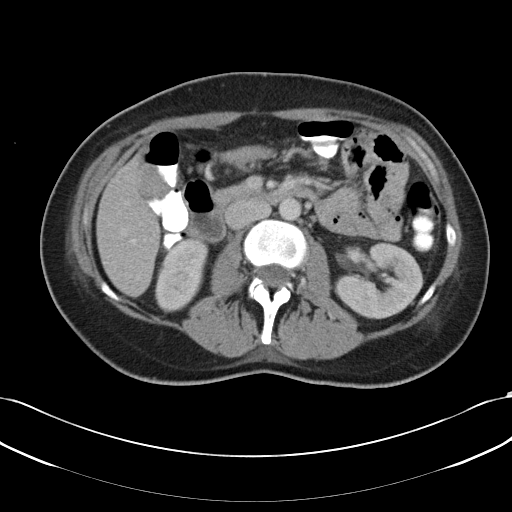
[im 62/86  soft-tissue]
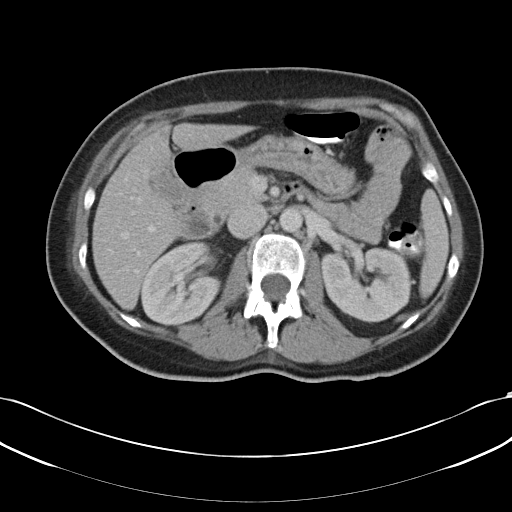
[im 67/86  soft-tissue]
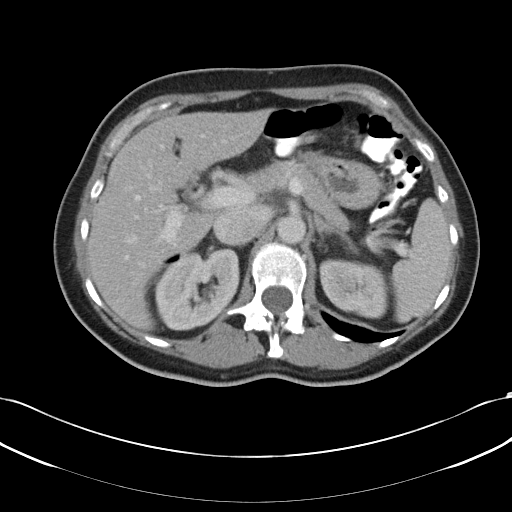
[im 76/86  soft-tissue]
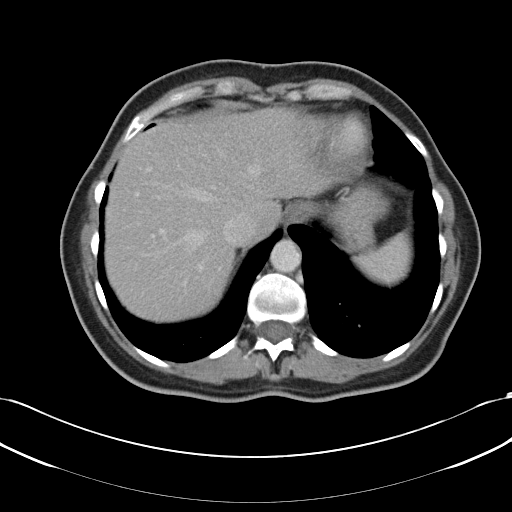
[im 81/86  soft-tissue]
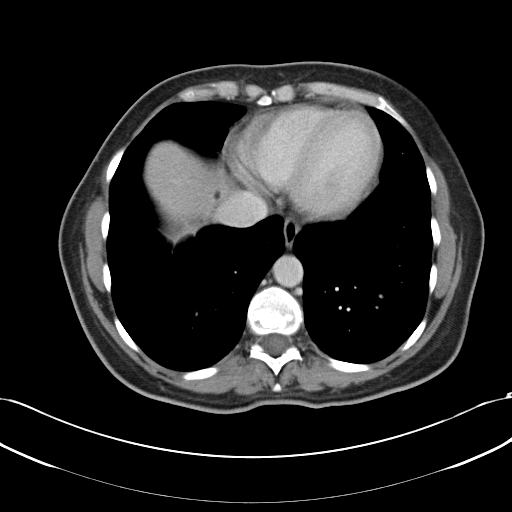

[Series 602: coronal abd · coronal · 0.84mm/px · 3 of 111 slices shown]
[im 37/111  soft-tissue]
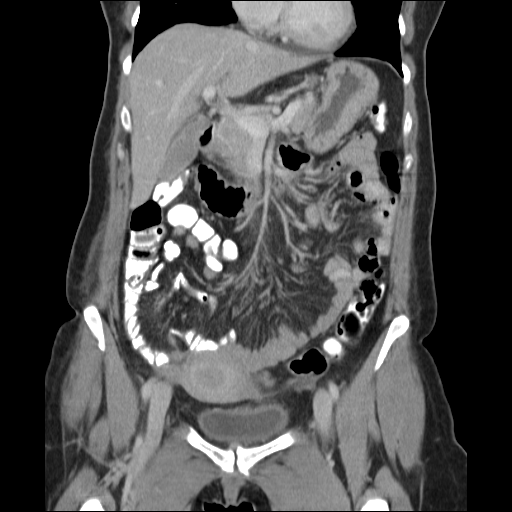
[im 49/111  soft-tissue]
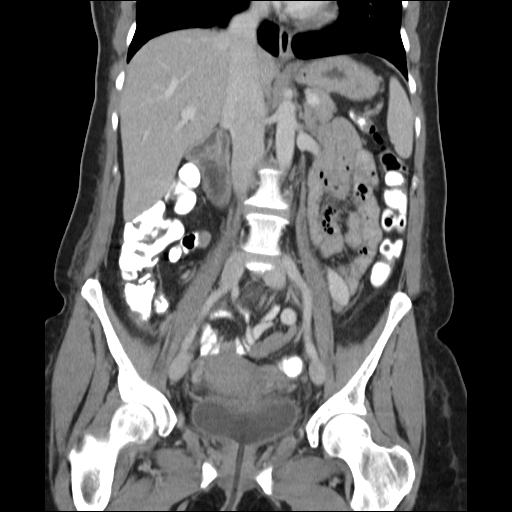
[im 62/111  soft-tissue]
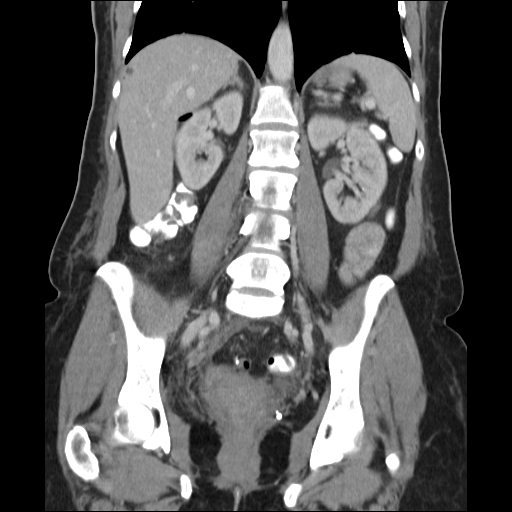

[17 of 46 positions shown; findings below may reference images not displayed]

FINDINGS: Lung bases are clear.
 There is free air due to recent surgery.  There is one small low attenuation lesion located peripherally in the right hepatic lobe.  This measures 10 Hounsfield units and is likely a benign hepatic cyst.  I don?t see findings worrisome for metastatic liver disease. There is focal fatty change in the falciform ligament.  The spleen is normal.  Pancreas, adrenal glands and kidneys demonstrate no significant abnormalities. There is a small cyst associated with the right kidney. The aorta is normal in caliber. Major branch vessels are normal. No mesenteric, retroperitoneal masses or adenopathy.  The stomach is not well distended. No gross abnormalities are seen. The duodenum, small bowel and colon demonstrate no significant findings.
IMPRESSION: 1.  One small low attenuation lesion in the liver peripherally in the right hepatic lobe is likely a benign hepatic cyst.  No findings worrisome for metastatic disease involving the liver and no mesenteric or retroperitoneal abnormality.
 2.  Free air due to recent surgery. Lung bases are clear.
 PELVIS CT WITH CONTRAST:
FINDINGS: Surgical changes involving the rectum.  There is a moderate amount of perirectal and presacral fluid. No pelvic adenopathy.  There is a Foley catheter in the bladder.  There is some air in the vagina.
IMPRESSION: Expected postoperative changes involving the rectum.  No pelvic masses or adenopathy.

## 2008-05-29 ENCOUNTER — Ambulatory Visit: Payer: Self-pay | Admitting: Oncology

## 2008-05-29 LAB — FECAL OCCULT BLOOD, GUAIAC: Occult Blood: NEGATIVE

## 2008-10-08 ENCOUNTER — Ambulatory Visit: Payer: Self-pay | Admitting: Oncology

## 2008-10-10 ENCOUNTER — Encounter: Admission: RE | Admit: 2008-10-10 | Discharge: 2008-10-10 | Payer: Self-pay | Admitting: Oncology

## 2008-10-10 ENCOUNTER — Ambulatory Visit (HOSPITAL_COMMUNITY): Admission: RE | Admit: 2008-10-10 | Discharge: 2008-10-10 | Payer: Self-pay | Admitting: Oncology

## 2008-10-10 LAB — CBC WITH DIFFERENTIAL/PLATELET
BASO%: 0.2 % (ref 0.0–2.0)
Basophils Absolute: 0 10*3/uL (ref 0.0–0.1)
EOS%: 1.4 % (ref 0.0–7.0)
HGB: 12.5 g/dL (ref 11.6–15.9)
MCH: 30.2 pg (ref 26.0–34.0)
RDW: 13.6 % (ref 11.3–14.5)
lymph#: 1.2 10*3/uL (ref 0.9–3.3)

## 2008-10-11 LAB — COMPREHENSIVE METABOLIC PANEL
ALT: 14 U/L (ref 0–35)
AST: 16 U/L (ref 0–37)
Albumin: 4 g/dL (ref 3.5–5.2)
BUN: 12 mg/dL (ref 6–23)
Calcium: 8.8 mg/dL (ref 8.4–10.5)
Chloride: 105 mEq/L (ref 96–112)
Potassium: 3.9 mEq/L (ref 3.5–5.3)
Total Protein: 6.8 g/dL (ref 6.0–8.3)

## 2008-10-16 ENCOUNTER — Other Ambulatory Visit: Admission: RE | Admit: 2008-10-16 | Discharge: 2008-10-16 | Payer: Self-pay | Admitting: Obstetrics and Gynecology

## 2009-04-16 ENCOUNTER — Ambulatory Visit: Payer: Self-pay | Admitting: Oncology

## 2009-04-18 ENCOUNTER — Ambulatory Visit (HOSPITAL_COMMUNITY): Admission: RE | Admit: 2009-04-18 | Discharge: 2009-04-18 | Payer: Self-pay | Admitting: Oncology

## 2009-04-18 LAB — CEA: CEA: 1.3 ng/mL (ref 0.0–5.0)

## 2009-04-18 LAB — CBC WITH DIFFERENTIAL/PLATELET
Basophils Absolute: 0 10*3/uL (ref 0.0–0.1)
Eosinophils Absolute: 0 10*3/uL (ref 0.0–0.5)
HCT: 35.5 % (ref 34.8–46.6)
HGB: 12 g/dL (ref 11.6–15.9)
LYMPH%: 28.9 % (ref 14.0–49.7)
MONO#: 0.3 10*3/uL (ref 0.1–0.9)
NEUT#: 2.5 10*3/uL (ref 1.5–6.5)
NEUT%: 63.1 % (ref 38.4–76.8)
Platelets: 207 10*3/uL (ref 145–400)
WBC: 4 10*3/uL (ref 3.9–10.3)

## 2009-04-18 LAB — COMPREHENSIVE METABOLIC PANEL
BUN: 11 mg/dL (ref 6–23)
CO2: 24 mEq/L (ref 19–32)
Creatinine, Ser: 0.59 mg/dL (ref 0.40–1.20)
Glucose, Bld: 86 mg/dL (ref 70–99)
Sodium: 138 mEq/L (ref 135–145)
Total Bilirubin: 0.4 mg/dL (ref 0.3–1.2)
Total Protein: 6.4 g/dL (ref 6.0–8.3)

## 2009-04-18 LAB — LACTATE DEHYDROGENASE: LDH: 176 U/L (ref 94–250)

## 2009-10-08 ENCOUNTER — Ambulatory Visit: Payer: Self-pay | Admitting: Oncology

## 2009-10-08 LAB — CBC WITH DIFFERENTIAL/PLATELET
BASO%: 0.4 % (ref 0.0–2.0)
LYMPH%: 26.8 % (ref 14.0–49.7)
MCHC: 33.8 g/dL (ref 31.5–36.0)
MONO#: 0.3 10*3/uL (ref 0.1–0.9)
MONO%: 5.7 % (ref 0.0–14.0)
NEUT#: 3.2 10*3/uL (ref 1.5–6.5)
Platelets: 226 10*3/uL (ref 145–400)
RBC: 4.15 10*6/uL (ref 3.70–5.45)
RDW: 13.8 % (ref 11.2–14.5)
WBC: 4.9 10*3/uL (ref 3.9–10.3)

## 2009-10-09 LAB — COMPREHENSIVE METABOLIC PANEL
ALT: 12 U/L (ref 0–35)
Albumin: 4.1 g/dL (ref 3.5–5.2)
Alkaline Phosphatase: 66 U/L (ref 39–117)
CO2: 26 mEq/L (ref 19–32)
Potassium: 3.7 mEq/L (ref 3.5–5.3)
Sodium: 143 mEq/L (ref 135–145)
Total Bilirubin: 0.3 mg/dL (ref 0.3–1.2)
Total Protein: 6.9 g/dL (ref 6.0–8.3)

## 2009-10-21 ENCOUNTER — Encounter: Admission: RE | Admit: 2009-10-21 | Discharge: 2009-10-21 | Payer: Self-pay | Admitting: Oncology

## 2009-10-21 ENCOUNTER — Ambulatory Visit (HOSPITAL_COMMUNITY): Admission: RE | Admit: 2009-10-21 | Discharge: 2009-10-21 | Payer: Self-pay | Admitting: Oncology

## 2010-04-21 ENCOUNTER — Ambulatory Visit: Payer: Self-pay | Admitting: Oncology

## 2010-04-22 ENCOUNTER — Encounter: Admission: RE | Admit: 2010-04-22 | Discharge: 2010-04-22 | Payer: Self-pay | Admitting: Oncology

## 2010-04-22 LAB — CBC WITH DIFFERENTIAL/PLATELET
BASO%: 0.5 % (ref 0.0–2.0)
EOS%: 2 % (ref 0.0–7.0)
LYMPH%: 34.4 % (ref 14.0–49.7)
MCH: 30.1 pg (ref 25.1–34.0)
MCHC: 34.1 g/dL (ref 31.5–36.0)
MCV: 88.3 fL (ref 79.5–101.0)
MONO#: 0.4 10*3/uL (ref 0.1–0.9)
MONO%: 7.9 % (ref 0.0–14.0)
NEUT%: 55.2 % (ref 38.4–76.8)
Platelets: 218 10*3/uL (ref 145–400)
RBC: 3.9 10*6/uL (ref 3.70–5.45)
WBC: 4.6 10*3/uL (ref 3.9–10.3)

## 2010-04-22 LAB — COMPREHENSIVE METABOLIC PANEL
ALT: 15 U/L (ref 0–35)
AST: 20 U/L (ref 0–37)
Alkaline Phosphatase: 70 U/L (ref 39–117)
Creatinine, Ser: 0.64 mg/dL (ref 0.40–1.20)
Sodium: 141 mEq/L (ref 135–145)
Total Bilirubin: 0.4 mg/dL (ref 0.3–1.2)
Total Protein: 6.8 g/dL (ref 6.0–8.3)

## 2010-11-03 ENCOUNTER — Ambulatory Visit: Payer: Self-pay | Admitting: Oncology

## 2010-11-05 ENCOUNTER — Ambulatory Visit (HOSPITAL_COMMUNITY)
Admission: RE | Admit: 2010-11-05 | Discharge: 2010-11-05 | Payer: Self-pay | Source: Home / Self Care | Attending: Oncology | Admitting: Oncology

## 2010-11-05 LAB — CBC WITH DIFFERENTIAL/PLATELET
Basophils Absolute: 0 10*3/uL (ref 0.0–0.1)
Eosinophils Absolute: 0.1 10*3/uL (ref 0.0–0.5)
HGB: 13.1 g/dL (ref 11.6–15.9)
LYMPH%: 39.1 % (ref 14.0–49.7)
MCV: 87.2 fL (ref 79.5–101.0)
MONO#: 0.3 10*3/uL (ref 0.1–0.9)
MONO%: 8.1 % (ref 0.0–14.0)
NEUT#: 1.8 10*3/uL (ref 1.5–6.5)
Platelets: 230 10*3/uL (ref 145–400)
RBC: 4.36 10*6/uL (ref 3.70–5.45)
WBC: 3.6 10*3/uL — ABNORMAL LOW (ref 3.9–10.3)

## 2010-11-05 LAB — LACTATE DEHYDROGENASE: LDH: 171 U/L (ref 94–250)

## 2010-11-05 LAB — COMPREHENSIVE METABOLIC PANEL
Albumin: 3.9 g/dL (ref 3.5–5.2)
Alkaline Phosphatase: 86 U/L (ref 39–117)
BUN: 9 mg/dL (ref 6–23)
CO2: 28 mEq/L (ref 19–32)
Glucose, Bld: 94 mg/dL (ref 70–99)
Potassium: 3.7 mEq/L (ref 3.5–5.3)
Sodium: 141 mEq/L (ref 135–145)
Total Bilirubin: 0.7 mg/dL (ref 0.3–1.2)
Total Protein: 7 g/dL (ref 6.0–8.3)

## 2010-11-05 LAB — FECAL OCCULT BLOOD, GUAIAC: Occult Blood: NEGATIVE

## 2010-11-05 LAB — CEA: CEA: 0.9 ng/mL (ref 0.0–5.0)

## 2010-11-06 ENCOUNTER — Encounter
Admission: RE | Admit: 2010-11-06 | Discharge: 2010-11-06 | Payer: Self-pay | Source: Home / Self Care | Attending: Oncology | Admitting: Oncology

## 2010-12-15 ENCOUNTER — Emergency Department (HOSPITAL_BASED_OUTPATIENT_CLINIC_OR_DEPARTMENT_OTHER)
Admission: EM | Admit: 2010-12-15 | Discharge: 2010-12-15 | Payer: Self-pay | Source: Home / Self Care | Admitting: Emergency Medicine

## 2010-12-17 LAB — CBC
HCT: 39.5 % (ref 36.0–46.0)
Hemoglobin: 13.5 g/dL (ref 12.0–15.0)
MCH: 28.7 pg (ref 26.0–34.0)
MCHC: 34.2 g/dL (ref 30.0–36.0)
MCV: 83.9 fL (ref 78.0–100.0)
Platelets: 262 10*3/uL (ref 150–400)
RBC: 4.71 MIL/uL (ref 3.87–5.11)
RDW: 13.2 % (ref 11.5–15.5)
WBC: 6.7 10*3/uL (ref 4.0–10.5)

## 2010-12-17 LAB — COMPREHENSIVE METABOLIC PANEL
ALT: 20 U/L (ref 0–35)
AST: 24 U/L (ref 0–37)
Albumin: 4.7 g/dL (ref 3.5–5.2)
Alkaline Phosphatase: 120 U/L — ABNORMAL HIGH (ref 39–117)
BUN: 10 mg/dL (ref 6–23)
CO2: 25 mEq/L (ref 19–32)
Calcium: 9.5 mg/dL (ref 8.4–10.5)
Chloride: 104 mEq/L (ref 96–112)
Creatinine, Ser: 0.6 mg/dL (ref 0.4–1.2)
GFR calc Af Amer: >60 mL/min (ref >60)
GFR calc non Af Amer: >60 mL/min (ref >60)
Glucose, Bld: 137 mg/dL — ABNORMAL HIGH (ref 70–99)
Potassium: 3.9 mEq/L (ref 3.5–5.1)
Sodium: 145 mEq/L (ref 135–145)
Total Bilirubin: 0.6 mg/dL (ref 0.3–1.2)
Total Protein: 8.2 g/dL (ref 6.0–8.3)

## 2010-12-17 LAB — DIFFERENTIAL
Basophils Absolute: 0 10*3/uL (ref 0.0–0.1)
Basophils Relative: 0 % (ref 0–1)
Eosinophils Absolute: 0 10*3/uL (ref 0.0–0.7)
Eosinophils Relative: 0 % (ref 0–5)
Lymphocytes Relative: 17 % (ref 12–46)
Lymphs Abs: 1.2 10*3/uL (ref 0.7–4.0)
Monocytes Absolute: 0.3 10*3/uL (ref 0.1–1.0)
Monocytes Relative: 5 % (ref 3–12)
Neutro Abs: 5.1 10*3/uL (ref 1.7–7.7)
Neutrophils Relative %: 77 % (ref 43–77)

## 2010-12-17 LAB — LIPASE, BLOOD: Lipase: 121 U/L (ref 23–300)

## 2010-12-19 ENCOUNTER — Other Ambulatory Visit (HOSPITAL_COMMUNITY): Payer: Self-pay | Admitting: Pediatrics

## 2010-12-19 DIAGNOSIS — Z853 Personal history of malignant neoplasm of breast: Secondary | ICD-10-CM

## 2010-12-19 DIAGNOSIS — Z85038 Personal history of other malignant neoplasm of large intestine: Secondary | ICD-10-CM

## 2010-12-20 ENCOUNTER — Other Ambulatory Visit: Payer: Self-pay | Admitting: Oncology

## 2010-12-20 ENCOUNTER — Encounter: Payer: Self-pay | Admitting: Gynecology

## 2010-12-20 DIAGNOSIS — Z85038 Personal history of other malignant neoplasm of large intestine: Secondary | ICD-10-CM

## 2010-12-20 DIAGNOSIS — Z853 Personal history of malignant neoplasm of breast: Secondary | ICD-10-CM

## 2010-12-20 DIAGNOSIS — C50919 Malignant neoplasm of unspecified site of unspecified female breast: Secondary | ICD-10-CM

## 2010-12-20 DIAGNOSIS — C189 Malignant neoplasm of colon, unspecified: Secondary | ICD-10-CM

## 2011-01-14 ENCOUNTER — Other Ambulatory Visit (HOSPITAL_COMMUNITY): Payer: Self-pay | Admitting: Gastroenterology

## 2011-01-14 DIAGNOSIS — R1013 Epigastric pain: Secondary | ICD-10-CM

## 2011-01-29 ENCOUNTER — Other Ambulatory Visit: Payer: Self-pay | Admitting: Gastroenterology

## 2011-01-30 ENCOUNTER — Encounter (HOSPITAL_COMMUNITY): Payer: Self-pay

## 2011-01-30 ENCOUNTER — Encounter (HOSPITAL_COMMUNITY)
Admission: RE | Admit: 2011-01-30 | Discharge: 2011-01-30 | Disposition: A | Discharge status: Home / Self Care | Payer: BC Managed Care – PPO | Source: Ambulatory Visit | Attending: Gastroenterology | Admitting: Gastroenterology

## 2011-01-30 ENCOUNTER — Other Ambulatory Visit (HOSPITAL_COMMUNITY): Payer: Self-pay | Admitting: Gastroenterology

## 2011-01-30 DIAGNOSIS — R1013 Epigastric pain: Secondary | ICD-10-CM | POA: Insufficient documentation

## 2011-01-30 DIAGNOSIS — R1011 Right upper quadrant pain: Secondary | ICD-10-CM | POA: Insufficient documentation

## 2011-01-30 HISTORY — DX: Unspecified abdominal pain: R10.9

## 2011-01-30 MED ORDER — TECHNETIUM TC 99M MEBROFENIN IV KIT
5.1000 | PACK | Freq: Once | INTRAVENOUS | Status: AC | PRN
  Administered 2011-01-30: 5 via INTRAVENOUS

## 2011-04-14 NOTE — Consult Note (Signed)
Shari Sexton, Shari Sexton               ACCOUNT NO.:  [id number]   MEDICAL RECORD NO.:  [id number]          PATIENT TYPE:  OUT   LOCATION:  GYN                          FACILITY:  [facility]   PHYSICIAN:  Shari A. Duard Brady, MD    DATE OF BIRTH:  Apr 27, 1961   DATE OF CONSULTATION:  01/17/2008  DATE OF DISCHARGE:                                 CONSULTATION   REFERRING PHYSICIAN:  Leda Quail, MD   Shari Sexton is a very pleasant 50 year old, gravida 1, para 1, who has  had multiple issues recently.  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  She underwent a right partial  mastectomy by Shari Sexton in November 2005.  She had two prior  lumpectomies with positive margins.  She was subsequently treated with  postoperative radiation therapy and has been on tamoxifen 20 mg daily  which is to continue through March 2011.  She had genetic testing for  BRCA 1 and 2 which was negative.  She subsequently was diagnosed with a  T3, N2, M0 colon cancer and had surgery in August 2007. At that time she  underwent rectosigmoid resection with primary anastomosis and right  salpingo-oophorectomy.  She had 4 out of 24 lymph nodes being positive.  She had negative margins but was subsequently treated with postoperative  therapy consisting of radiation therapy given Xeloda for  chemosensitization followed by six cycles of  Zeldox chemotherapy which  she completed April 2008.  The patient had as genetic testing for NLH 1,  MSH 2, MSH 6 sequencing with comprehensive rearrangement. There were no  mutations detected.  This was performed December 23 of the year 2008.  She has not been seen by Shari Sexton since that time and went to see Dr.  Hyacinth Sexton for consideration of hysterectomy. Shari Sexton wish for [place] to see  her.  She comes in today for consultation. Since completing radiation  therapy for colon cancer, she has essentially been menopausal in that  she has not had any menses.  She  has rare hot flashes.  She has no  gynecologic symptoms.   MEDICATIONS:  Tamoxifen 20 mg daily.   ALLERGIES:  None.   PAST SURGICAL HISTORY:  1. Right-sided partial mastectomy in 2005.  2. Exploratory laparotomy.  3. Rectosigmoid resection.  4. Right salpingo-oophorectomy in 2007.  5. Primary cesarean section via transverse skin incision.  6. Tubal ligation.  7. Carpal tunnel surgery on her left hand.   SOCIAL HISTORY:  She is married.  She has a 65-year-old daughter, Shari Sexton.   FAMILY HISTORY:  She has a maternal first cousin with breast cancer in  her 33s.  She has another maternal first cousin with breast cancer in  her 85s. These two are not sisters.  They have never had genetic  testing.  She has a paternal aunt with breast cancer at the age of 64  and a paternal great grandmother with ovarian cancer at the age of 3.   HEALTH MAINTENANCE:  She had a colonoscopy in August 2008 per report  which was negative  and a mammogram in November 2008 that was negative.   PHYSICAL EXAMINATION:  GENERAL:  Well-nourished, alert female in no  acute distress.  NECK:  Supple with no lymphadenopathy, no thyromegaly.  LUNGS:  Clear to auscultation bilaterally.  CARDIOVASCULAR:  Regular rate and rhythm.  ABDOMEN:  She has well-healed infraumbilical vertical skin incision.  She has a midline incision which is vertical with no evidence of  incisional hernia.  She has a transverse skin incision.  There is some  keloid in the incisions inferiorly at the level of the pubic symphysis.  Abdomen is soft and nontender.  There is no palpable masses or  hepatosplenomegaly.  Groins are negative for adenopathy.  EXTREMITIES:  She was no edema.  PELVIC:  External genitalia is within normal limits.  Vagina is  atrophic.  The cervix is visualized and is nulliparous.  Bimanual  examination:  The cervix is palpably normal.  The corpus of normal size,  shape, and consistency.  No adnexal masses.   ASSESSMENT:   A 50 year old, gravida 1, para 1, with history of ductal  carcinoma in situ of the right breast in 2005 and a T3, N2, M0 colon  cancer in 2007.  She has had a negative genetic testing for both BRCA  one and BRCA 2 and HNPCC related mutations.  She comes in for  consideration and discussion regarding prophylactic hysterectomy, left  salpingo-oophorectomy. While I do agree the patient is at higher than  normal risk, her normal genetic testing is somewhat reassuring, but I  did discuss with her as did the genetic counselors that these testings,  while reassuring, are not absolute, and there may be unknown mutations  that have not been tested for that the patient may harbor.  She has a  significant cancer history from both her maternal and paternal side.  While I cannot definitively quantitate a risk for her, I do believe that  even with a negative genetic testing, she still is at some risk.  We had  a lengthy and frank discussion regarding hysterectomy.  Postoperatively,  from her colon cancer surgery, the patient reports having a Foley  catheter in or in and out catheterization for 3 months ago, though 1  month of that time was with concurrent pelvic radiotherapy.  However,  from the time of her surgery to her radiation, she has urinary retention  requiring continuous drainage or in and out catheterization.  I  discussed with her that was real possibility again as it did not make  sense why she developed somewhat neurogenic bladder after that surgery  as there was probably very little to no bladder manipulation during the  case, and typically anesthesia-related bladder atony does not last long.  We also discussed the issues regarding pelvic radiotherapy and the  effect that might have on a hysterectomy.  I did discuss that she is at  increased risk for both vesicovaginal, colovaginal, and ureteral vaginal  fistulas due to the pelvic radiation.  She states she was not aware of  that.  If  those occurred for the bladder, it may require prolonged  bladder drainage, and it if was a rectovaginal fistula, she may require  an additional procedure.  These are issues she was not aware of. At this  point, she is undecided whether or not she would like to proceed with  surgery.  We did discuss in the interim if she is not interested in  surgery, I think it would be very reasonable to  proceed with  transvaginal ultrasound evaluating endometrial stripe thickness and her  left adnexa on a 42-month basis with a CA-125. These can be performed in  Dr. Rondel Baton office if Shari Sexton is agreeable to this.  Her questions  were elicited and answered to her satisfaction.  She has our card.  She  will contact us should she wish to proceed with surgery.  I do think  there could be an attempt at laparoscopic surgery, though the patient  would need to be counseled and she will most likely require a  laparotomy.   The patient also mentioned that she needs right-sided carpal tunnel  surgery. It would be possible to consider concurrent procedures  depending on the days that Dr. Mina Marble, who is her surgeon, is  available.      Shari A. Duard Brady, MD  Electronically Signed     PAG/MEDQ  D:  01/17/2008  T:  01/18/2008  Job:  [id number]   cc:   Shari Quail, MD   Pierce Crane, MD  Fax: [phone]   Artist Pais. Kathrynn Running, M.D.  Fax: [phone]   Artist Pais. Mina Marble, M.D.  Fax: [phone]   Telford Nab, R.N.  [address]  [place], [place] [address]

## 2011-04-17 NOTE — Op Note (Signed)
Shari Sexton, Shari Sexton               ACCOUNT NO.:  [id number]   MEDICAL RECORD NO.:  [id number]          PATIENT TYPE:  AMB   LOCATION:  DSC                          FACILITY:  [facility]   PHYSICIAN:  Rose Phi. Maple Hudson, M.D.   DATE OF BIRTH:  November 15, 1961   DATE OF PROCEDURE:  11/27/2004  DATE OF DISCHARGE:                                 OPERATIVE REPORT   PREOPERATIVE DIAGNOSIS:  Ductal carcinoma in situ of the right breast with  close margin, inferior.   POSTOPERATIVE DIAGNOSIS:  Ductal carcinoma in situ of the right breast with  close margin, inferior.   OPERATION:  Excision of the inferior margin of right lumpectomy site.   SURGEON:  Rose Phi. Maple Hudson, M.D.   ANESTHESIA:  General.   OPERATIVE PROCEDURE:  After suitable general anesthesia was induced, the  patient was placed in supine position and the right breast prepped and  draped in usual fashion.  The lumpectomy had been in the medial portion of  the right breast with a transverse incision.  I opened the incision, entered  the seroma cavity, which was quite clean, but I aspirated and emptied it.  We thoroughly irrigated it out with saline.  I then exposed the inferior  margin and excised it from 3 o'clock to 9 o'clock.  Hemostasis was obtained  with the cautery.  We then thoroughly infiltrated the incision with 0.25%  Marcaine.  It was then closed in 2 layers with 3-0 Vicryl, subcuticular 4  Monocryl and then the skin glue.   The dressing was applied and the patient transferred to recovery room in  satisfactory condition, having tolerated the procedure well.      Pete   PRY/MEDQ  D:  11/27/2004  T:  11/27/2004  Job:  [id number]   cc:   Artist Pais Kathrynn Running, M.D.  [address] [facility]  [place]  [place] [address]  Fax: [phone]

## 2011-04-17 NOTE — Op Note (Signed)
Shari Sexton, Shari Sexton               ACCOUNT NO.:  [id number]   MEDICAL RECORD NO.:  [id number]          PATIENT TYPE:  AMB   LOCATION:  DSC                          FACILITY:  [facility]   PHYSICIAN:  Rose Phi. Maple Hudson, M.D.   DATE OF BIRTH:  15-May-1961   DATE OF PROCEDURE:  10/13/2004  DATE OF DISCHARGE:                                 OPERATIVE REPORT   PREOPERATIVE DIAGNOSIS:  Ductal carcinoma in situ of the right breast.   POSTOPERATIVE DIAGNOSIS:  Ductal carcinoma in situ of the right breast.   OPERATION:  Right partial mastectomy with needle localization and specimen  mammogram.   SURGEON:  Rose Phi. Maple Hudson, M.D.   ANESTHESIA:  General.   OPERATIVE PROCEDURE:  Prior to coming to the operating room, a localizing  wire had been placed in the medial portion of her right breast where the  biopsy-proven DCIS was.   After suitable general anesthesia was induced, the patient was placed in the  supine position with the arms extended on the arm board and the right breast  prepped and draped in the usual fashion.  A radial incision was then made  using the previously placed wire as a guard and an X marking the area of the  lesion.  The radial incision was then made and a wide excision of the wire  and surrounding tissue was carried out.  Specimen mammogram confirmed the  removal of the lesion.  Specimen oriented per the pathologist.  Hemostasis  obtained with the cautery.   It was then closed with a single layer of interrupted subcuticular 4-0  Monocryl and Steri-Strips.  Dressing applied.  The patient transferred to  the recovery room in satisfactory condition having tolerated the procedure  well.      Pete   PRY/MEDQ  D:  10/13/2004  T:  10/13/2004  Job:  [id number]

## 2011-04-17 NOTE — Op Note (Signed)
[facility]  Patient:    Shari Sexton, Shari Sexton                        MRN: [id number] Proc. Date: 10/29/00 Attending:  Douglass Rivers, M.D.                           Operative Report  PREOPERATIVE DIAGNOSES: 1. Advanced maternal age. 2. Gestational diabetes. 3. Macrosomia.  POSTOPERATIVE DIAGNOSES: 1. Advanced maternal age. 2. Gestational diabetes. 3. Macrosomia.  PROCEDURE:                    Primary low transverse cesarean section.  SURGEON:                      Douglass Rivers, M.D.  ASSISTANT:                    Katy Fitch, M.D.  ANESTHESIA:                   Spinal.  ESTIMATED BLOOD LOSS:         300 cc.  FINDINGS:                     A viable female infant.  Vertex presentation. Floating clear amniotic fluid.  Apgars 9 and 9. Birth weight 8 pounds 8 ounces.  Normal uterus, tube and ovaries.  PATHOLOGY:                    None.  COMPLICATIONS:                None.  DESCRIPTION OF PROCEDURE:     The patient was taken to the operating room, where spinal anesthesia was induced.  The patient was placed in the supine position in a left lateral tilt.  Abdomen was prepped and draped in the usual sterile fashion.  A Pfannenstiel skin incision was made with a scalpel and carried to the underlying layer of fascia with a second blade.  The fascia was scored in the midline, incision was extended laterally with the Mayo scissors.  The inferior aspect of the fascial incision was grasped with Kochers.  The underlying rectus muscles were dissected off with both blunt and sharp dissection.  In a similar fashion the superior aspect of the fascial incision was grasped with Kocher.  Underlying rectus muscles were dissected off and the rectus muscles were separated in the midline.  The peritoneum was identified and entered bluntly.  The peritoneal incision was entered superiorly and inferiorly; good visualization of the underlying bowel and bladder.   ______ use was confirmed and the bladder blade was inserted.  The vesicouretoperitoneum was identified, tented up and entered sharply with the Metzenbaum.  The incision was extended laterally.  A bladder flap was created digitally.  The bladder blade was then reinserted in the lower uterine segment.  It was incised in a transverse fashion with scalpel.  The infant was delivered with the aid of ______ .  The vertex was floating. Cord was cut and clamped.  The infant was handed off to the awaiting pediatricians.  Cord bloods were obtained.  The uterus was massaged.  The placenta was manually extracted.  The uterus was clear of all clots and debris.  A uterine incision was repaired with a running lock layer of 0 chromic.  The incision was noted  to be hemostatic.  The gutters were cleared of all clots and debris.  The adnexae noted to be normal.  The peritoneum and muscle wall is noted to be hemostatic.  The fascia was closed with 0 Vicryl, starting at the apex and aiming toward the midline bilaterally.  The subcutaneous was irrigated.  The skin was closed with staples.  The patient tolerated the procedure well.  Sponge, lap and needle counts were correct x 2.  She was given Ancef intraoperatively and transferred to the PACU in stable condition. DD:  10/29/00 TD:  10/29/00 Job: [id number]

## 2011-04-17 NOTE — Discharge Summary (Signed)
NAMEDONNIS, PECHA NO.:  [id number]   MEDICAL RECORD NO.:  [id number]          PATIENT TYPE:  INP   LOCATION:  5734                         FACILITY:  [facility]   PHYSICIAN:  Gita Kudo, M.D. DATE OF BIRTH:  [birth year]   DATE OF ADMISSION:  07/19/2006  DATE OF DISCHARGE:  07/25/2006                                 DISCHARGE SUMMARY   CHIEF COMPLAINT:  Tumor.   HISTORY OF PRESENT ILLNESS:  This 50 year old female was admitted for  elective colectomy.  The colonoscopy done by Dr. Evette Cristal for diarrhea and  anemia revealed two lesions of her distal rectosigmoid.  Biopsy showed a.  After a good bowel prep, she came in for surgery.   LABORATORY STUDIES:  Pathology showed adenocarcinoma of the distal rectum  with a clear margin of approximately 3 cm.  The tumor was 7 cm in size.  Proximal to this was a benign tubulovillous adenoma of the rectum.  Her  right ovary was removed and showed a hemorrhagic cyst, benign tube, no  malignancy.  The specimen had 24 lymph nodes removed and four of these were  positive.  The tumor did extend into the pericolonic connective tissue  without perforation.  There was lymphovascular invasion.   Initial hemoglobin was 12, hematocrit 36.  Followup hemoglobin 9, hematocrit  27.  CMET were normal throughout with a slightly low potassium of 3.1.  The  CEA was elevated.   Her preoperative chest x-ray showed no active disease.  A CAT scan  postoperatively showed postop changes, no evidence of metastatic disease.  It did show a small nodule at the dome of her liver which I had palpated  during surgery and felt it to be a benign little cyst.  EKG was normal.   HOSPITAL COURSE:  On the morning of admission, she underwent an uneventful  low anterior resection with primary stapled anastomosis.  Postoperatively,  she did well except for some urinary retention requiring reinsertion of her  catheter.  On postop day #6, August 26, she was allowed  home.  Her catheter  had been removed and she was beginning to void.  She was comfortable.  The  incision looked good and she had regained bowel function.  She will be  discharged on analgesics, vitamins with iron.  She will also be seeing  radiation and medical oncology for further treatment.   DISCHARGE DIAGNOSES:  1. Adenocarcinoma of rectum, tubulovillous adenoma rectosigmoid.  2. Benign right ovary.   PROCEDURES:  Low anterior resection of the rectosigmoid colon, right  oophorectomy.  Postop complications with urinary retention.   CONSULTATIONS:  None.   CONDITION ON DISCHARGE:  Good.           ______________________________  Gita Kudo, M.D.     MRL/MEDQ  D:  07/25/2006  T:  07/26/2006  Job:  [id number]   cc:   L. Lupe Carney, M.D.  Graylin Shiver, M.D.  Pierce Crane, M.D.  [facility]  [facility]

## 2011-04-17 NOTE — Discharge Summary (Signed)
[facility]  Patient:    KRISTYNA, BRADSTREET                      MRN: [id number] Adm. Date:  [id number] Disc. Date: [id number] Attending:  Douglass Rivers Dictator:   Antony Contras, [facility]                           Discharge Summary  DISCHARGE DIAGNOSES:          1. Intrauterine pregnancy at 39 weeks.                               2. History of gestational diabetes,                                  diet-controlled.                               3. Probable macrosomia.  PROCEDURES:                   Low cervical transverse cesarean section with delivery of a viable infant.  HISTORY OF PRESENT ILLNESS:   The patient is a 50 year old primigravida with an EDC of October 30, 2000.  Prenatal risk factors include gestational diabetes, diet-controlled, advanced maternal age, and possible macrosomia.  PRENATAL LABORATORY DATA:     Blood type A positive, antibody screen negative. RPR, HBsAg, HIV nonreactive.  MSAFP normal.  GBS positive.  HOSPITAL COURSE:              The patient was admitted on October 29, 2000, for elective cesarean section secondary to possible macrosomia.  This was performed by Dr. Farrel Gobble, assisted by Dr. Penni Homans, under spinal anesthesia. She delivered an Apgar 9/9 female infant with weight of 8 pounds 8 ounces. Normal pelvic anatomy.  Postoperative course:  The patient remained afebrile. She had some difficulty breast-feeding.  No difficulty voiding.  She was able to be discharged on her third postoperative day in satisfactory condition. Postpartum hemoglobin was 12.1.  FOLLOW-UP:                    In six weeks.  MEDICATIONS:                  Continue with prenatal vitamins and iron. Motrin and Tylox for pain. DD:  11/26/00 TD:  11/26/00 Job: [id number]

## 2011-04-17 NOTE — Op Note (Signed)
Shari Sexton, Shari Sexton               ACCOUNT NO.:  [id number]   MEDICAL RECORD NO.:  [id number]          PATIENT TYPE:  AMB   LOCATION:  [facility]                         FACILITY:  [facility]   PHYSICIAN:  Ivor Costa. Farrel Gobble, M.D. DATE OF BIRTH:  1961-10-18   DATE OF PROCEDURE:  02/06/2005  DATE OF DISCHARGE:                                 OPERATIVE REPORT   PREOPERATIVE DIAGNOSES:  Undesired fertility.   POSTOPERATIVE DIAGNOSES:  Undesired fertility.   PROCEDURE:  Laparoscopic bilateral tubal ligation with cautery.   SURGEON:  Ivor Costa. Farrel Gobble, M.D.   ANESTHESIA:  General.   ESTIMATED BLOOD LOSS:  Minimal.   FINDINGS:  Normal appearing uterus, tubes, ovaries, liver, gallbladder and  appendix.   COMPLICATIONS:  None.   DESCRIPTION OF PROCEDURE:  The patient was taken to the operating room,  general anesthesia was induced, placed in the dorsal lithotomy position,  prepped and draped in the usual sterile fashion, bivalve speculum was placed  in the vagina. The cervix was visualized and uterine manipulator was then  placed. Attention was then turned to the abdomen and an infraumbilical  incision was made with the scalpel through which the Veress needle was  inserted, free flow of fluid was noted, opening pressure was 3. A  pneumoperitoneum was created until tympany was appreciated above the liver  after which a #10/11 disposable trocar was inserted through the  infraumbilical port, placement in the abdomen was confirmed. The patient was  then placed in Trendelenburg position, the uterus was elevated. The pelvis  was inspected, the findings were as above, the tubes were noted to be freely  mobile. A Kleppinger was then advanced through the infraumbilical port and  each tube was cauterized in series for three. The instruments were then  removed, the pneumoperitoneum was then released. The infraumbilical fascia  was closed with #0 Vicryl. The skin was reapproximated with Dermabond  followed  by Steri-Strips. The infraumbilical port was injected with 0.25%  Marcaine, total of 8 mL. The uterine manipulator was then removed, the  speculum was placed and hemostasis of the cervix was confirmed.      THL/MEDQ  D:  02/06/2005  T:  02/06/2005  Job:  [id number]

## 2011-04-17 NOTE — Op Note (Signed)
NAMEANILA, BOJARSKI               ACCOUNT NO.:  [id number]   MEDICAL RECORD NO.:  [id number]          PATIENT TYPE:  AMB   LOCATION:  DSC                          FACILITY:  [facility]   PHYSICIAN:  Rose Phi. Maple Hudson, M.D.   DATE OF BIRTH:  Jul 09, 1961   DATE OF PROCEDURE:  11/03/2004  DATE OF DISCHARGE:                                 OPERATIVE REPORT   PREOPERATIVE DIAGNOSIS:  Ductal carcinoma in situ of the right breast with  close margins.   POSTOPERATIVE DIAGNOSIS:  Ductal carcinoma in situ of the right breast with  close margins.   OPERATION PERFORMED:  Re-excision of lumpectomy site.   SURGEON:  Rose Phi. Maple Hudson, M.D.   ANESTHESIA:  General.   DESCRIPTION OF PROCEDURE:  After suitable general anesthesia was induced,  the patient was placed in supine position with the right breast prepped and  draped in the usual sterile fashion.  The transverse incision in the medial  portion of the right breast was incised and then the previous lumpectomy  site was excised.  Hemostasis was obtained with a cautery.  The specimen was  oriented for the pathologist.  Incision was injected with 0.25% Marcaine.  Subcuticular closure of 4-0 Monocryl and Steri-Strips carried out.  Dressing  applied.  The patient was then transferred to the recovery room in  satisfactory condition having tolerated the procedure well.      Pete   PRY/MEDQ  D:  11/03/2004  T:  11/03/2004  Job:  [id number]

## 2011-04-17 NOTE — Op Note (Signed)
Shari Sexton, Shari Sexton NO.:  [id number]   MEDICAL RECORD NO.:  [id number]          PATIENT TYPE:  INP   LOCATION:  2899                         FACILITY:  [facility]   PHYSICIAN:  Gita Kudo, M.D. DATE OF BIRTH:  01-02-1961   DATE OF PROCEDURE:  07/19/2006  DATE OF DISCHARGE:                                 OPERATIVE REPORT   OPERATIVE PROCEDURE:  Resection rectosigmoid tumor - polyp, with low  anterior anastomosis, right oophorectomy, preprocedure rigid sigmoidoscopy.   SURGEON:  [place].   ASSISTANT:  Streck.   ANESTHESIA:  General endotracheal.   PREOPERATIVE DIAGNOSIS:  Large tubovillous rectosigmoid - actually 2,  extending from approximately 10 to 20 cm.   POSTOPERATIVE DIAGNOSIS:  Large tubovillous rectosigmoid - actually 2,  extending from approximately 10 to 20 cm, ovarian cyst, right side.   CLINICAL SUMMARY:  A 50 year old female admitted for elective colon  resection.  She had large TVAs found on recent colonoscopy, and they were  biopsied.  She had symptoms of bleeding and diarrhea.   OPERATIVE FINDINGS:  The patient had a normal laparotomy.  The liver,  stomach, small bowel and large bowel down to the rectum were normal.  Her  uterus was present and had a small fibroid.  There was a large ovarian cyst,  and when punctured, it bled and occupied most of the right ovary and  therefore was removed for good hemostasis.  There was a bulky tumor at  approximately 10 cm by rigid proctoscopy extending up to at least 15-18 cm.   OPERATIVE PROCEDURE:  Under satisfactory general endotracheal anesthesia,  having received preoperative heparin, antibiotics and a good bowel prep; a  nasogastric tube and Foley catheter were placed.  She was then positioned in  the lithotomy position and sigmoidoscoped with the findings mentioned above.  She was left in the modified yellow fin stirrups and prepped and draped in  the standard fashion.  A midline incision was  made from the pubis to above  the umbilicus and carried into the peritoneum.  She had previous C-section  and did not have much of the way of adhesions at all.  Laparotomy revealed  the findings mentioned above, and we could feel the bulky tumor.  With good  exposure and self-retaining retractors placed, the medial and lateral  peritoneal reflections were taken down with cautery, and then using a  combination of clamped cut tying and LigaSure technique, the mesentery was  divided except for the major vessels which were tied with silk.  The  proximal sigmoid was transected with the TA-60 device, and using this as a  handle, the dissection continued down, freeing up the peritoneal reflection  and getting excellent distal length.  When we were sure that we could feel  the tumor and were well below it, the TA-55 was used to divide the distal  rectum and the specimen removed.  It was inspected on the table, and we had  at least 3 cm distally and a good proximal margin.  Following this, the  staple line of the proximal sigmoid was oversewn with  a running 3-0 silk for  both hemostasis and a second layer.  A stab wound made and the tenia just  above this to accommodate the EEA type #28 anvil.  A 2-0 Prolene pursestring  had been placed and tied and then a second pursestring around this getting a  good secure closure around the device.   At this point, Dr. Jamey Ripa went below and placed the other portion of the  stapler and brought it up through the distal stump anteriorly.  The two ends  were mated and fired.  Then, the patient was sigmoidoscoped, showing a good  anastomosis, and air was insufflated and there was no bubbling.   The two portions and the device were then inspected, and there were complete  donuts of colon tissue.   The abdomen was then lavaged with saline after gloves were changed.  A  single 3-0 silk suture was used to approximate the mesentery.  The  anastomosis lay without  tension and had excellent blood supply.  Sponge and  needle counts were correct and the abdomen closed with a running double  stranded #1 PDS suture followed by staples for skin and a sterile absorbent  dressing.  There were no complications.  The sponge and needle counts were  correct.  She went to the recovery room from the operating room in good  condition.   ADDENDUM:  At the very beginning of the procedure, the large right ovary was  noted, and it was decompressed.  There was a fair amount of blood, and I  removed by clamp, cutting and tying the ovarian vessels and tube with 2-0  silk.  This was sent as a separate specimen.   No complications.           ______________________________  Gita Kudo, M.D.     MRL/MEDQ  D:  07/19/2006  T:  07/19/2006  Job:  [id number]   cc:   L. Lupe Carney, M.D.  Graylin Shiver, M.D.  Pierce Crane, M.D.

## 2011-05-07 ENCOUNTER — Other Ambulatory Visit: Payer: Self-pay | Admitting: Oncology

## 2011-05-07 ENCOUNTER — Encounter (HOSPITAL_BASED_OUTPATIENT_CLINIC_OR_DEPARTMENT_OTHER): Payer: BC Managed Care – PPO | Admitting: Oncology

## 2011-05-07 ENCOUNTER — Encounter (HOSPITAL_COMMUNITY)
Admission: RE | Admit: 2011-05-07 | Discharge: 2011-05-07 | Disposition: A | Discharge status: Home / Self Care | Payer: BC Managed Care – PPO | Source: Ambulatory Visit | Attending: Oncology | Admitting: Oncology

## 2011-05-07 ENCOUNTER — Encounter (HOSPITAL_COMMUNITY): Payer: Self-pay

## 2011-05-07 ENCOUNTER — Encounter (HOSPITAL_COMMUNITY)
Admission: RE | Admit: 2011-05-07 | Discharge: 2011-05-07 | Disposition: A | Discharge status: Home / Self Care | Payer: BC Managed Care – PPO | Source: Ambulatory Visit | Attending: Pediatrics | Admitting: Pediatrics

## 2011-05-07 DIAGNOSIS — Y842 Radiological procedure and radiotherapy as the cause of abnormal reaction of the patient, or of later complication, without mention of misadventure at the time of the procedure: Secondary | ICD-10-CM | POA: Insufficient documentation

## 2011-05-07 DIAGNOSIS — K8020 Calculus of gallbladder without cholecystitis without obstruction: Secondary | ICD-10-CM | POA: Insufficient documentation

## 2011-05-07 DIAGNOSIS — J701 Chronic and other pulmonary manifestations due to radiation: Secondary | ICD-10-CM | POA: Insufficient documentation

## 2011-05-07 DIAGNOSIS — I700 Atherosclerosis of aorta: Secondary | ICD-10-CM | POA: Insufficient documentation

## 2011-05-07 DIAGNOSIS — Z85038 Personal history of other malignant neoplasm of large intestine: Secondary | ICD-10-CM

## 2011-05-07 DIAGNOSIS — C50919 Malignant neoplasm of unspecified site of unspecified female breast: Secondary | ICD-10-CM

## 2011-05-07 DIAGNOSIS — C19 Malignant neoplasm of rectosigmoid junction: Secondary | ICD-10-CM

## 2011-05-07 DIAGNOSIS — D0590 Unspecified type of carcinoma in situ of unspecified breast: Secondary | ICD-10-CM

## 2011-05-07 DIAGNOSIS — M47814 Spondylosis without myelopathy or radiculopathy, thoracic region: Secondary | ICD-10-CM | POA: Insufficient documentation

## 2011-05-07 DIAGNOSIS — Z853 Personal history of malignant neoplasm of breast: Secondary | ICD-10-CM

## 2011-05-07 DIAGNOSIS — C189 Malignant neoplasm of colon, unspecified: Secondary | ICD-10-CM

## 2011-05-07 DIAGNOSIS — N200 Calculus of kidney: Secondary | ICD-10-CM | POA: Insufficient documentation

## 2011-05-07 HISTORY — DX: Malignant (primary) neoplasm, unspecified: C80.1

## 2011-05-07 LAB — CBC WITH DIFFERENTIAL/PLATELET
BASO%: 0.7 % (ref 0.0–2.0)
Basophils Absolute: 0 10*3/uL (ref 0.0–0.1)
EOS%: 1.4 % (ref 0.0–7.0)
HCT: 36.7 % (ref 34.8–46.6)
HGB: 12.6 g/dL (ref 11.6–15.9)
LYMPH%: 32.4 % (ref 14.0–49.7)
MCH: 29.8 pg (ref 25.1–34.0)
MCHC: 34.3 g/dL (ref 31.5–36.0)
MCV: 86.8 fL (ref 79.5–101.0)
MONO%: 6.8 % (ref 0.0–14.0)
NEUT%: 58.7 % (ref 38.4–76.8)

## 2011-05-07 LAB — CMP (CANCER CENTER ONLY)
ALT(SGPT): 21 U/L (ref 10–47)
CO2: 30 mEq/L (ref 18–33)
Calcium: 8.9 mg/dL (ref 8.0–10.3)
Chloride: 100 mEq/L (ref 98–108)
Creat: 0.7 mg/dl (ref 0.6–1.2)
Total Protein: 7 g/dL (ref 6.4–8.1)

## 2011-05-07 LAB — CEA: CEA: 1.7 ng/mL (ref 0.0–5.0)

## 2011-05-07 LAB — GLUCOSE, CAPILLARY: Glucose-Capillary: 97 mg/dL (ref 70–99)

## 2011-05-07 LAB — VITAMIN D 25 HYDROXY (VIT D DEFICIENCY, FRACTURES): Vit D, 25-Hydroxy: 47 ng/mL (ref 30–89)

## 2011-05-07 MED ORDER — IOHEXOL 300 MG/ML  SOLN: 100.0000 mL | Freq: Once | INTRAMUSCULAR | Status: AC | PRN

## 2011-05-07 MED ORDER — FLUDEOXYGLUCOSE F - 18 (FDG) INJECTION
14.1000 | Freq: Once | INTRAVENOUS | Status: AC | PRN
  Administered 2011-05-07: 14.1 via INTRAVENOUS

## 2011-05-07 MED ORDER — IOHEXOL 300 MG/ML  SOLN
100.0000 mL | Freq: Once | INTRAMUSCULAR | Status: AC | PRN
  Administered 2011-05-07: 100 mL via INTRAVENOUS

## 2011-05-11 ENCOUNTER — Other Ambulatory Visit: Payer: Self-pay | Admitting: Oncology

## 2011-05-11 ENCOUNTER — Encounter (HOSPITAL_BASED_OUTPATIENT_CLINIC_OR_DEPARTMENT_OTHER): Payer: BC Managed Care – PPO | Admitting: Oncology

## 2011-05-11 DIAGNOSIS — Z853 Personal history of malignant neoplasm of breast: Secondary | ICD-10-CM

## 2011-05-11 DIAGNOSIS — Z85038 Personal history of other malignant neoplasm of large intestine: Secondary | ICD-10-CM

## 2011-05-11 DIAGNOSIS — C50919 Malignant neoplasm of unspecified site of unspecified female breast: Secondary | ICD-10-CM

## 2011-05-13 ENCOUNTER — Other Ambulatory Visit: Payer: Self-pay | Admitting: Oncology

## 2011-05-13 DIAGNOSIS — D0590 Unspecified type of carcinoma in situ of unspecified breast: Secondary | ICD-10-CM

## 2011-05-13 DIAGNOSIS — C19 Malignant neoplasm of rectosigmoid junction: Secondary | ICD-10-CM

## 2011-07-14 ENCOUNTER — Other Ambulatory Visit: Payer: Self-pay | Admitting: Oncology

## 2011-07-14 DIAGNOSIS — Z9889 Other specified postprocedural states: Secondary | ICD-10-CM

## 2011-08-21 ENCOUNTER — Other Ambulatory Visit: Payer: Self-pay | Admitting: Oncology

## 2011-10-29 ENCOUNTER — Other Ambulatory Visit: Payer: Self-pay | Admitting: Oncology

## 2011-10-29 DIAGNOSIS — Z9889 Other specified postprocedural states: Secondary | ICD-10-CM

## 2011-10-29 DIAGNOSIS — Z853 Personal history of malignant neoplasm of breast: Secondary | ICD-10-CM

## 2011-11-09 ENCOUNTER — Ambulatory Visit
Admission: RE | Admit: 2011-11-09 | Discharge: 2011-11-09 | Disposition: A | Discharge status: Home / Self Care | Payer: BC Managed Care – PPO | Source: Ambulatory Visit | Attending: Oncology | Admitting: Oncology

## 2011-11-09 DIAGNOSIS — Z9889 Other specified postprocedural states: Secondary | ICD-10-CM

## 2011-11-11 ENCOUNTER — Other Ambulatory Visit (HOSPITAL_BASED_OUTPATIENT_CLINIC_OR_DEPARTMENT_OTHER): Payer: BC Managed Care – PPO | Admitting: Lab

## 2011-11-11 ENCOUNTER — Ambulatory Visit (HOSPITAL_COMMUNITY)
Admission: RE | Admit: 2011-11-11 | Discharge: 2011-11-11 | Disposition: A | Discharge status: Home / Self Care | Payer: BC Managed Care – PPO | Source: Ambulatory Visit | Attending: Oncology | Admitting: Oncology

## 2011-11-11 ENCOUNTER — Other Ambulatory Visit: Payer: Self-pay | Admitting: Oncology

## 2011-11-11 ENCOUNTER — Encounter (HOSPITAL_COMMUNITY)
Admission: RE | Admit: 2011-11-11 | Discharge: 2011-11-11 | Disposition: A | Discharge status: Home / Self Care | Payer: BC Managed Care – PPO | Source: Ambulatory Visit | Attending: Oncology | Admitting: Oncology

## 2011-11-11 ENCOUNTER — Encounter (HOSPITAL_COMMUNITY): Payer: Self-pay

## 2011-11-11 DIAGNOSIS — C189 Malignant neoplasm of colon, unspecified: Secondary | ICD-10-CM | POA: Insufficient documentation

## 2011-11-11 DIAGNOSIS — C50919 Malignant neoplasm of unspecified site of unspecified female breast: Secondary | ICD-10-CM

## 2011-11-11 DIAGNOSIS — C19 Malignant neoplasm of rectosigmoid junction: Secondary | ICD-10-CM

## 2011-11-11 DIAGNOSIS — Z923 Personal history of irradiation: Secondary | ICD-10-CM | POA: Insufficient documentation

## 2011-11-11 DIAGNOSIS — Z9049 Acquired absence of other specified parts of digestive tract: Secondary | ICD-10-CM | POA: Insufficient documentation

## 2011-11-11 DIAGNOSIS — Z902 Acquired absence of lung [part of]: Secondary | ICD-10-CM | POA: Insufficient documentation

## 2011-11-11 DIAGNOSIS — Z9221 Personal history of antineoplastic chemotherapy: Secondary | ICD-10-CM | POA: Insufficient documentation

## 2011-11-11 DIAGNOSIS — Z980 Intestinal bypass and anastomosis status: Secondary | ICD-10-CM | POA: Insufficient documentation

## 2011-11-11 DIAGNOSIS — K8020 Calculus of gallbladder without cholecystitis without obstruction: Secondary | ICD-10-CM | POA: Insufficient documentation

## 2011-11-11 DIAGNOSIS — I700 Atherosclerosis of aorta: Secondary | ICD-10-CM | POA: Insufficient documentation

## 2011-11-11 LAB — CBC WITH DIFFERENTIAL/PLATELET
BASO%: 4.2 % — ABNORMAL HIGH (ref 0.0–2.0)
Eosinophils Absolute: 0.3 10*3/uL (ref 0.0–0.5)
HCT: 39.3 % (ref 34.8–46.6)
MCHC: 32.8 g/dL (ref 31.5–36.0)
MONO#: 0.6 10*3/uL (ref 0.1–0.9)
NEUT#: 9.7 10*3/uL — ABNORMAL HIGH (ref 1.5–6.5)
NEUT%: 74.3 % (ref 38.4–76.8)
RBC: 4.48 10*6/uL (ref 3.70–5.45)
WBC: 13 10*3/uL — ABNORMAL HIGH (ref 3.9–10.3)
lymph#: 2 10*3/uL (ref 0.9–3.3)

## 2011-11-11 LAB — CMP (CANCER CENTER ONLY)
ALT(SGPT): 21 U/L (ref 10–47)
CO2: 31 mEq/L (ref 18–33)
Calcium: 8.6 mg/dL (ref 8.0–10.3)
Chloride: 102 mEq/L (ref 98–108)
Creat: 0.7 mg/dl (ref 0.6–1.2)
Glucose, Bld: 96 mg/dL (ref 73–118)

## 2011-11-11 LAB — TECHNOLOGIST REVIEW: Technologist Review: 5

## 2011-11-11 LAB — LACTATE DEHYDROGENASE: LDH: 296 U/L — ABNORMAL HIGH (ref 94–250)

## 2011-11-11 LAB — CEA: CEA: 0.9 ng/mL (ref 0.0–5.0)

## 2011-11-11 MED ORDER — FLUDEOXYGLUCOSE F - 18 (FDG) INJECTION
17.9000 | Freq: Once | INTRAVENOUS | Status: AC | PRN
  Administered 2011-11-11: 17.9 via INTRAVENOUS

## 2011-11-11 MED ORDER — IOHEXOL 300 MG/ML  SOLN
100.0000 mL | Freq: Once | INTRAMUSCULAR | Status: AC | PRN
  Administered 2011-11-11: 100 mL via INTRAVENOUS

## 2011-11-19 ENCOUNTER — Ambulatory Visit (HOSPITAL_BASED_OUTPATIENT_CLINIC_OR_DEPARTMENT_OTHER): Payer: BC Managed Care – PPO | Admitting: Oncology

## 2011-11-19 DIAGNOSIS — Z85048 Personal history of other malignant neoplasm of rectum, rectosigmoid junction, and anus: Secondary | ICD-10-CM

## 2011-11-19 DIAGNOSIS — Z853 Personal history of malignant neoplasm of breast: Secondary | ICD-10-CM

## 2011-11-19 DIAGNOSIS — C189 Malignant neoplasm of colon, unspecified: Secondary | ICD-10-CM

## 2011-11-19 DIAGNOSIS — C50919 Malignant neoplasm of unspecified site of unspecified female breast: Secondary | ICD-10-CM

## 2011-11-19 NOTE — Progress Notes (Signed)
Hematology and Oncology Follow Up Visit  Shari Sexton [id number] [birth year] 50 y.o. 11/19/2011 5:19 PM PCP  1. Principle Diagnosis: 2. T3 anterior rectosigmoid cancer, status post resection in 2007 followed by radiation therapy given with Xeloda for chemosensitization.  Followed by 6 cycles of XELOX chemotherapy completed April 2008.   History of ductal carcinoma in situ of the breast status post lumpectomy and Tamoxifen therapy 20 mg a day.  Date of surgery 01/11/2005.   Interim History:  There have been no intercurrent illness, hospitalizations or medication changes.  Medications: I have reviewed the patient's current medications.  Allergies: No Known Allergies  Past Medical History, Surgical history, Social history, and Family History were reviewed and updated.  Review of Systems: Constitutional:  Negative for fever, chills, night sweats, anorexia, weight loss, pain. Cardiovascular: no chest pain or dyspnea on exertion Respiratory: no cough, shortness of breath, or wheezing Neurological: no TIA or stroke symptoms Dermatological: negative ENT: negative Skin Gastrointestinal: no abdominal pain, change in bowel habits, or black or bloody stools Genito-Urinary: no dysuria, trouble voiding, or hematuria Hematological and Lymphatic: negative Breast: negative for breast lumps Musculoskeletal: negative Remaining ROS negative.  Physical Exam: Blood pressure 122/81, pulse 75, temperature 98.3 F (36.8 C), temperature source Oral, weight 123 lb 4.8 oz (55.929 kg). ECOG:  General appearance: alert, cooperative and appears stated age Head: Normocephalic, without obvious abnormality, atraumatic Neck: no adenopathy, no carotid bruit, no JVD, supple, symmetrical, trachea midline and thyroid not enlarged, symmetric, no tenderness/mass/nodules Lymph nodes: Cervical, supraclavicular, and axillary nodes normal. Cardiac : regular rate and rhythm, no murmurs or gallops Pulmonary:clear to  auscultation bilaterally and normal percussion bilaterally Breasts: inspection negative, no nipple discharge or bleeding, no masses or nodularity palpable Abdomen:soft, non-tender; bowel sounds normal; no masses,  no organomegaly Extremities negative Neuro: alert, oriented, normal speech, no focal findings or movement disorder noted  Lab Results: Lab Results  Component Value Date   WBC 13.0* 11/11/2011   HGB 12.9 11/11/2011   HCT 39.3 11/11/2011   MCV 87.7 11/11/2011   PLT 296 11/11/2011     Chemistry      Component Value Date/Time   NA 140 11/11/2011 0855   NA 145 12/15/2010 0510   K 3.9 11/11/2011 0855   K 3.9 12/15/2010 0510   CL 102 11/11/2011 0855   CL 104 12/15/2010 0510   CO2 31 11/11/2011 0855   CO2 25 12/15/2010 0510   BUN 13 11/11/2011 0855   BUN 10 12/15/2010 0510   CREATININE 0.7 11/11/2011 0855   CREATININE .6 12/15/2010 0510      Component Value Date/Time   CALCIUM 8.6 11/11/2011 0855   CALCIUM 9.5 12/15/2010 0510   ALKPHOS 92* 11/11/2011 0855   ALKPHOS 120* 12/15/2010 0510   AST 29 11/11/2011 0855   AST 24 12/15/2010 0510   ALT 20 12/15/2010 0510   BILITOT 0.60 11/11/2011 0855   BILITOT 0.6 12/15/2010 0510      .pathology. Radiological Studies: chest X-ray n/a Mammogram 12/12- wnl Bone density n/a  Impression and Plan: [name] is doing well, without evidence of disease, I will see her in 6 months. I believe she has a colonoscopy planned next year.  More than 50% of the visit was spent in patient-related counselling   Pierce Crane, MD 12/20/20125:19 PM

## 2011-11-20 ENCOUNTER — Telehealth: Payer: Self-pay | Admitting: *Deleted

## 2011-11-20 NOTE — Telephone Encounter (Signed)
gave patient appointment for scans

## 2011-11-25 ENCOUNTER — Telehealth: Payer: Self-pay | Admitting: *Deleted

## 2011-11-25 NOTE — Telephone Encounter (Signed)
spoke with Shari Sexton at the breast center she has been trying to get in touch with this patient and no luck I called and left message also trying to get patient scheduled for her mri of the breast

## 2011-11-26 ENCOUNTER — Telehealth: Payer: Self-pay | Admitting: *Deleted

## 2011-11-26 NOTE — Telephone Encounter (Signed)
left message to inform the patient that the breast center is trying to get in touch with her to schedule her mri of the breast

## 2011-12-01 HISTORY — PX: PICC LINE PLACE PERIPHERAL (ARMC HX): HXRAD1248

## 2011-12-02 ENCOUNTER — Telehealth: Payer: Self-pay | Admitting: *Deleted

## 2011-12-02 NOTE — Telephone Encounter (Signed)
per message from [facility] patient does not need the mri of the breast until sometime in 2013 she will contact the patient with date and time date

## 2011-12-10 ENCOUNTER — Ambulatory Visit
Admission: RE | Admit: 2011-12-10 | Discharge: 2011-12-10 | Disposition: A | Discharge status: Home / Self Care | Payer: BC Managed Care – PPO | Source: Ambulatory Visit | Attending: Oncology | Admitting: Oncology

## 2011-12-10 DIAGNOSIS — Z853 Personal history of malignant neoplasm of breast: Secondary | ICD-10-CM

## 2011-12-10 DIAGNOSIS — Z9889 Other specified postprocedural states: Secondary | ICD-10-CM

## 2011-12-10 MED ORDER — GADOBENATE DIMEGLUMINE 529 MG/ML IV SOLN
11.0000 mL | Freq: Once | INTRAVENOUS | Status: AC | PRN
  Administered 2011-12-10: 11 mL via INTRAVENOUS

## 2012-04-06 DIAGNOSIS — C9210 Chronic myeloid leukemia, BCR/ABL-positive, not having achieved remission: Secondary | ICD-10-CM

## 2012-04-06 HISTORY — DX: Chronic myeloid leukemia, BCR/ABL-positive, not having achieved remission: C92.10

## 2012-04-08 DIAGNOSIS — C9210 Chronic myeloid leukemia, BCR/ABL-positive, not having achieved remission: Secondary | ICD-10-CM | POA: Insufficient documentation

## 2012-04-08 DIAGNOSIS — C9211 Chronic myeloid leukemia, BCR/ABL-positive, in remission: Secondary | ICD-10-CM | POA: Insufficient documentation

## 2012-05-10 HISTORY — PX: BONE MARROW TRANSPLANT: SHX200

## 2012-05-19 ENCOUNTER — Ambulatory Visit: Payer: BC Managed Care – PPO | Admitting: Oncology

## 2012-05-19 ENCOUNTER — Other Ambulatory Visit: Payer: BC Managed Care – PPO | Admitting: Lab

## 2012-06-07 ENCOUNTER — Encounter: Payer: Self-pay | Admitting: Oncology

## 2012-06-08 DIAGNOSIS — K810 Acute cholecystitis: Secondary | ICD-10-CM | POA: Insufficient documentation

## 2012-06-09 DIAGNOSIS — K819 Cholecystitis, unspecified: Secondary | ICD-10-CM | POA: Insufficient documentation

## 2012-09-21 ENCOUNTER — Ambulatory Visit (HOSPITAL_BASED_OUTPATIENT_CLINIC_OR_DEPARTMENT_OTHER): Payer: BC Managed Care – PPO | Admitting: Lab

## 2012-09-21 ENCOUNTER — Other Ambulatory Visit: Payer: Self-pay | Admitting: *Deleted

## 2012-09-21 DIAGNOSIS — C9210 Chronic myeloid leukemia, BCR/ABL-positive, not having achieved remission: Secondary | ICD-10-CM

## 2012-09-21 LAB — TECHNOLOGIST REVIEW

## 2012-09-21 LAB — CBC WITH DIFFERENTIAL/PLATELET
Eosinophils Absolute: 0.1 10*3/uL (ref 0.0–0.5)
HGB: 11.9 g/dL (ref 11.6–15.9)
MCV: 96.1 fL (ref 79.5–101.0)
MONO%: 16.8 % — ABNORMAL HIGH (ref 0.0–14.0)
NEUT#: 2.4 10*3/uL (ref 1.5–6.5)
RBC: 3.82 10*6/uL (ref 3.70–5.45)
RDW: 17.2 % — ABNORMAL HIGH (ref 11.2–14.5)
WBC: 3.9 10*3/uL (ref 3.9–10.3)
lymph#: 0.7 10*3/uL — ABNORMAL LOW (ref 0.9–3.3)
nRBC: 0 % (ref 0–0)

## 2012-10-19 ENCOUNTER — Other Ambulatory Visit: Payer: Self-pay | Admitting: *Deleted

## 2012-10-19 ENCOUNTER — Other Ambulatory Visit: Payer: Self-pay | Admitting: Obstetrics & Gynecology

## 2012-10-19 DIAGNOSIS — C9210 Chronic myeloid leukemia, BCR/ABL-positive, not having achieved remission: Secondary | ICD-10-CM

## 2012-10-19 DIAGNOSIS — C189 Malignant neoplasm of colon, unspecified: Secondary | ICD-10-CM

## 2012-10-19 DIAGNOSIS — Z1231 Encounter for screening mammogram for malignant neoplasm of breast: Secondary | ICD-10-CM

## 2012-10-19 DIAGNOSIS — C50919 Malignant neoplasm of unspecified site of unspecified female breast: Secondary | ICD-10-CM

## 2012-11-10 ENCOUNTER — Ambulatory Visit
Admission: RE | Admit: 2012-11-10 | Discharge: 2012-11-10 | Disposition: A | Discharge status: Home / Self Care | Payer: BC Managed Care – PPO | Source: Ambulatory Visit | Attending: Obstetrics & Gynecology | Admitting: Obstetrics & Gynecology

## 2012-11-10 DIAGNOSIS — Z1231 Encounter for screening mammogram for malignant neoplasm of breast: Secondary | ICD-10-CM

## 2012-11-17 ENCOUNTER — Encounter (HOSPITAL_COMMUNITY): Payer: Self-pay

## 2012-11-17 ENCOUNTER — Encounter (HOSPITAL_COMMUNITY)
Admission: RE | Admit: 2012-11-17 | Discharge: 2012-11-17 | Disposition: A | Discharge status: Home / Self Care | Payer: BC Managed Care – PPO | Source: Ambulatory Visit | Attending: Oncology | Admitting: Oncology

## 2012-11-17 DIAGNOSIS — C9210 Chronic myeloid leukemia, BCR/ABL-positive, not having achieved remission: Secondary | ICD-10-CM

## 2012-11-17 DIAGNOSIS — C189 Malignant neoplasm of colon, unspecified: Secondary | ICD-10-CM | POA: Insufficient documentation

## 2012-11-17 DIAGNOSIS — C50919 Malignant neoplasm of unspecified site of unspecified female breast: Secondary | ICD-10-CM

## 2012-11-17 LAB — GLUCOSE, CAPILLARY: Glucose-Capillary: 93 mg/dL (ref 70–99)

## 2012-11-17 MED ORDER — FLUDEOXYGLUCOSE F - 18 (FDG) INJECTION
16.9000 | Freq: Once | INTRAVENOUS | Status: AC | PRN
  Administered 2012-11-17: 16.9 via INTRAVENOUS

## 2013-01-11 DIAGNOSIS — I519 Heart disease, unspecified: Secondary | ICD-10-CM | POA: Insufficient documentation

## 2013-01-11 DIAGNOSIS — I5189 Other ill-defined heart diseases: Secondary | ICD-10-CM | POA: Insufficient documentation

## 2013-06-12 ENCOUNTER — Other Ambulatory Visit: Payer: Self-pay | Admitting: *Deleted

## 2013-06-12 DIAGNOSIS — C50919 Malignant neoplasm of unspecified site of unspecified female breast: Secondary | ICD-10-CM

## 2013-06-12 DIAGNOSIS — C189 Malignant neoplasm of colon, unspecified: Secondary | ICD-10-CM

## 2013-06-12 NOTE — Telephone Encounter (Signed)
Faxed refill request received from pharmacy for VITAMIN D [id number] Last filled by MD on 05/23/12, #26 x 0RF Last AEX - 11/11/11 Next AEX - not scheduled Last Vitamin D check 11/11/11 = 61 Please advise refills.  Chart on your shelf.

## 2013-06-12 NOTE — Telephone Encounter (Signed)
OK to refill until AEX due in December

## 2013-06-13 MED ORDER — VITAMIN D (ERGOCALCIFEROL) 1.25 MG (50000 UNIT) PO CAPS: 50000.0000 [IU] | ORAL_CAPSULE | ORAL | Status: DC

## 2013-06-13 NOTE — Telephone Encounter (Signed)
RX sent

## 2013-10-04 ENCOUNTER — Other Ambulatory Visit: Payer: Self-pay

## 2013-10-04 DIAGNOSIS — Z1231 Encounter for screening mammogram for malignant neoplasm of breast: Secondary | ICD-10-CM

## 2013-11-15 ENCOUNTER — Ambulatory Visit: Payer: BC Managed Care – PPO

## 2013-11-17 DIAGNOSIS — H2510 Age-related nuclear cataract, unspecified eye: Secondary | ICD-10-CM | POA: Insufficient documentation

## 2013-11-17 DIAGNOSIS — Z9481 Bone marrow transplant status: Secondary | ICD-10-CM | POA: Insufficient documentation

## 2013-11-17 DIAGNOSIS — H579 Unspecified disorder of eye and adnexa: Secondary | ICD-10-CM | POA: Insufficient documentation

## 2013-11-28 ENCOUNTER — Ambulatory Visit
Admission: RE | Admit: 2013-11-28 | Discharge: 2013-11-28 | Disposition: A | Discharge status: Home / Self Care | Payer: BC Managed Care – PPO | Source: Ambulatory Visit

## 2013-11-28 DIAGNOSIS — Z1231 Encounter for screening mammogram for malignant neoplasm of breast: Secondary | ICD-10-CM

## 2013-12-07 ENCOUNTER — Other Ambulatory Visit: Payer: Self-pay | Admitting: Obstetrics & Gynecology

## 2013-12-07 DIAGNOSIS — R928 Other abnormal and inconclusive findings on diagnostic imaging of breast: Secondary | ICD-10-CM

## 2013-12-08 ENCOUNTER — Telehealth: Payer: Self-pay | Admitting: Oncology

## 2013-12-08 NOTE — Telephone Encounter (Signed)
Faxed pt medical records to [facility],slides and scans will be fedex'ed °

## 2013-12-14 ENCOUNTER — Other Ambulatory Visit: Payer: BC Managed Care – PPO

## 2013-12-14 ENCOUNTER — Ambulatory Visit
Admission: RE | Admit: 2013-12-14 | Discharge: 2013-12-14 | Disposition: A | Discharge status: Home / Self Care | Payer: BC Managed Care – PPO | Source: Ambulatory Visit | Attending: Obstetrics & Gynecology | Admitting: Obstetrics & Gynecology

## 2013-12-14 DIAGNOSIS — R928 Other abnormal and inconclusive findings on diagnostic imaging of breast: Secondary | ICD-10-CM

## 2014-01-03 ENCOUNTER — Other Ambulatory Visit: Payer: Self-pay | Admitting: Internal Medicine

## 2014-01-03 DIAGNOSIS — D0590 Unspecified type of carcinoma in situ of unspecified breast: Secondary | ICD-10-CM

## 2014-01-17 ENCOUNTER — Other Ambulatory Visit: Payer: BC Managed Care – PPO

## 2014-01-24 ENCOUNTER — Other Ambulatory Visit: Payer: BC Managed Care – PPO

## 2014-01-31 ENCOUNTER — Other Ambulatory Visit: Payer: BC Managed Care – PPO

## 2014-02-05 ENCOUNTER — Ambulatory Visit
Admission: RE | Admit: 2014-02-05 | Discharge: 2014-02-05 | Disposition: A | Discharge status: Home / Self Care | Payer: BC Managed Care – PPO | Source: Ambulatory Visit | Attending: Internal Medicine | Admitting: Internal Medicine

## 2014-02-05 DIAGNOSIS — D0590 Unspecified type of carcinoma in situ of unspecified breast: Secondary | ICD-10-CM

## 2014-02-05 MED ORDER — GADOBENATE DIMEGLUMINE 529 MG/ML IV SOLN
13.0000 mL | Freq: Once | INTRAVENOUS | Status: AC | PRN
  Administered 2014-02-05: 13 mL via INTRAVENOUS

## 2014-02-23 ENCOUNTER — Telehealth: Payer: Self-pay | Admitting: Genetic Counselor

## 2014-02-23 NOTE — Telephone Encounter (Signed)
Patient called and left VM that she wanted copies of her BRCA and Lynch syndrome testing from 2006 and 2009 to be faxed to Mal Misty, genetic counselor at [facility].  Fax number is [phone].

## 2014-04-05 ENCOUNTER — Other Ambulatory Visit: Payer: Self-pay | Admitting: *Deleted

## 2014-04-25 ENCOUNTER — Other Ambulatory Visit: Payer: Self-pay | Admitting: Nurse Practitioner

## 2014-04-25 NOTE — Telephone Encounter (Signed)
Spoke with patient-last AEX was 11/11/11. States she was diagnosed with leukemia and had bone marrow transplant. Was advised by her doctors to watch where she would go and being around a lot of people. She feels like she is ok now to make an AEX appointment. Is aware that I will check with PG to see if she will refill this until her AEX, which is scheduled for 05/29/14. She does take this every Sunday and has about 2-3 pills left. If needed she can see if she can come in at an earlier time. It is hard for her to get off work, as she has missed quite a bit. Please advise if this is ok to refill. She is aware that we will want to recheck her level this year. She has blood work done at her office and feels like they have checked this in the past, just not sure when. If any problems, I will call her back on Monday, aware that PG is out of the office until then//kn

## 2014-04-25 NOTE — Telephone Encounter (Signed)
Lmtcb//kn 

## 2014-04-30 NOTE — Telephone Encounter (Signed)
Since we are now trying to switch people over to OTC Vit D 1000 IU daily I would do it that way for now.. We have not see her since 10/2011 and do not feel comfortable with a renewal.  If PCP/ Oncology has checked and wants to give then they can give the RX.

## 2014-05-14 NOTE — Telephone Encounter (Signed)
Patient aware to take OTC Vitamin D. She has had her levels checked at [facility] and it was 51. States she will bring all her results with her to her AEX//kn

## 2014-05-25 ENCOUNTER — Encounter: Payer: Self-pay | Admitting: Nurse Practitioner

## 2014-05-29 ENCOUNTER — Encounter: Payer: Self-pay | Admitting: Nurse Practitioner

## 2014-05-29 ENCOUNTER — Ambulatory Visit (INDEPENDENT_AMBULATORY_CARE_PROVIDER_SITE_OTHER): Payer: BC Managed Care – PPO | Admitting: Nurse Practitioner

## 2014-05-29 VITALS — BP 120/82 | HR 60 | Resp 16 | Ht 63.75 in | Wt 140.0 lb

## 2014-05-29 DIAGNOSIS — C9211 Chronic myeloid leukemia, BCR/ABL-positive, in remission: Secondary | ICD-10-CM

## 2014-05-29 DIAGNOSIS — Z85048 Personal history of other malignant neoplasm of rectum, rectosigmoid junction, and anus: Secondary | ICD-10-CM

## 2014-05-29 DIAGNOSIS — R8290 Unspecified abnormal findings in urine: Secondary | ICD-10-CM

## 2014-05-29 DIAGNOSIS — D0590 Unspecified type of carcinoma in situ of unspecified breast: Secondary | ICD-10-CM

## 2014-05-29 DIAGNOSIS — R82998 Other abnormal findings in urine: Secondary | ICD-10-CM

## 2014-05-29 DIAGNOSIS — Z0000 Encounter for general adult medical examination without abnormal findings: Secondary | ICD-10-CM

## 2014-05-29 DIAGNOSIS — Z01419 Encounter for gynecological examination (general) (routine) without abnormal findings: Secondary | ICD-10-CM

## 2014-05-29 DIAGNOSIS — D0591 Unspecified type of carcinoma in situ of right breast: Secondary | ICD-10-CM

## 2014-05-29 LAB — POCT URINALYSIS DIPSTICK
BILIRUBIN UA: NEGATIVE
GLUCOSE UA: NEGATIVE
KETONES UA: NEGATIVE
Nitrite, UA: NEGATIVE
PH UA: 6
Protein, UA: NEGATIVE
Urobilinogen, UA: NEGATIVE

## 2014-05-29 NOTE — Progress Notes (Signed)
Encounter reviewed by Dr. Brook Silva.  

## 2014-05-29 NOTE — Progress Notes (Signed)
53 y.o. G1P1 Married Caucasian Fe here for annual exam. She has multiple cancer diagnosis and is followed at [facility].   All cancer markers 3 months ago are good.  Recent abdominal pain and cramps with diarrhea that was felt to be viral GI illness.  Now all better.  She is now 2 years post Bone Marrow transplant in 06/2012 for treatment of CML.  She is now back to work for a year.  Trying to get back to 40 hours a week but not there yet.  She continues to wear a wig as her hair is growing back in only some places.  Our last visit with her was in 2012 before diagnosis of CML and was advised to limit exposure at other MD offices.  Patient's last menstrual period was 06/30/2006.          Sexually active: Yes.    The current method of family planning is post menopausal status.    Exercising: No.  The patient does not participate in regular exercise at present. Smoker:  Former  Health Maintenance: Pap:  10/2010 Neg MMG:  MRI 01/2014 BIRADS2 Colonoscopy:  2014 normal repeat in 5 years BMD:   03/2010 TDaP:  2014 Labs: Cancer center   reports that she quit smoking about 24 years ago. She has never used smokeless tobacco. She reports that she does not drink alcohol or use illicit drugs.  Past Medical History  Diagnosis Date  . Abdominal pain, other specified site   . Cancer   . Hx of lumpectomy 2005  . History of partial colectomy 2007  . Breast cancer   . Colon cancer   . Leukemia     History reviewed. No pertinent past surgical history.  Current Outpatient Prescriptions  Medication Sig Dispense Refill  . b complex vitamins capsule Take 1 capsule by mouth daily.      . carvedilol (COREG) 3.125 MG tablet TAKE 1 TABLET BY MOUTH 2 TIMES A DAY WITH MEALS.      Marland Kitchen lactase (LACTAID) 3000 UNITS tablet Take 3,000 Units by mouth as needed.      . nilotinib (TASIGNA) 200 MG capsule Take 200 mg by mouth.      Marland Kitchen olopatadine (PATANOL) 0.1 % ophthalmic solution 1 drop.      Marland Kitchen omeprazole (PRILOSEC) 40 MG  capsule TAKE 1 CAPSULE (40 MG TOTAL) BY MOUTH DAILY.       No current facility-administered medications for this visit.    Family History  Problem Relation Age of Onset  . Rectal cancer Mother   . Hypertension Mother   . Diabetes Mother   . Heart failure Father   . Colon polyps Father   . Hypertension Father   . Hypertension Sister   . Cancer Maternal Grandmother   . Heart failure Maternal Grandfather   . Cancer Paternal Grandmother     ROS:  Pertinent items are noted in HPI.  Otherwise, a comprehensive ROS was negative.  Exam:   BP 120/82  Pulse 60  Resp 16  Ht 5' 3.75" (1.619 m)  Wt 140 lb (63.504 kg)  BMI 24.23 kg/m2  LMP 06/30/2006 Height: 5' 3.75" (161.9 cm)  Ht Readings from Last 3 Encounters:  05/29/14 5' 3.75" (1.619 m)    General appearance: alert, cooperative and appears stated age Head: Normocephalic, without obvious abnormality, atraumatic Neck: no adenopathy, supple, symmetrical, trachea midline and thyroid normal to inspection and palpation Lungs: clear to auscultation bilaterally, port cath in place right upper chest wall  Breasts: normal appearance, no masses or tenderness, surgical and radiation changes on the right Heart: regular rate and rhythm Abdomen: soft, non-tender; no masses,  no organomegaly; multiple incisional scars on lower abdomen Extremities: extremities normal, atraumatic, no cyanosis or edema Skin: Skin color, texture, turgor normal. No rashes or lesions Lymph nodes: Cervical, supraclavicular, and axillary nodes normal. No abnormal inguinal nodes palpated Neurologic: Grossly normal   Pelvic: External genitalia:  no lesions              Urethra:  normal appearing urethra with no masses, tenderness or lesions              Bartholin's and Skene's: normal                 Vagina: normal appearing vagina with normal color and discharge, no lesions              Cervix: anteverted              Pap taken: Yes.   Bimanual Exam:  Uterus:   normal size, contour, position, consistency, mobility, non-tender              Adnexa: no mass, fullness, tenderness               Rectovaginal: Confirms               Anus:  normal sphincter tone, no lesions  A:  Well Woman with normal exam  Postmenopausal since 06/2006  R/O UTI - asymptomatic  S/P Right Breast Lumpectomy high grade DCIS treated with radiation, chemo and Tamoxifen 2005  S/P stage III B cancer of the distal rectum and rectosigmoid 06/14/2006 treated with lower resection and radiation treatment.   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  Then treated with Bone Marrow transplant 06/2012    P:   Reviewed health and wellness pertinent to exam  Pap smear taken today  Mammogram is due 01/2015  Will follow with urine C&S  Counseled on breast self exam, mammography screening, adequate intake of calcium and vitamin D, diet and exercise, Kegel's exercises return annually or prn  An After Visit Summary was printed and given to the patient.  Answered questions about her 50 year old daughter and irregular menses

## 2014-05-29 NOTE — Patient Instructions (Signed)

## 2014-05-30 LAB — URINALYSIS, MICROSCOPIC ONLY
BACTERIA UA: NONE SEEN
CASTS: NONE SEEN
Crystals: NONE SEEN
Squamous Epithelial / LPF: NONE SEEN

## 2014-05-31 NOTE — Addendum Note (Signed)
Addended by: Antonietta Barcelona on: 05/31/2014 05:43 PM   Modules accepted: Orders

## 2014-06-02 LAB — URINE CULTURE: Colony Count: 50000

## 2014-06-05 ENCOUNTER — Other Ambulatory Visit: Payer: Self-pay | Admitting: Nurse Practitioner

## 2014-06-05 MED ORDER — NITROFURANTOIN MONOHYD MACRO 100 MG PO CAPS: 100.0000 mg | ORAL_CAPSULE | Freq: Two times a day (BID) | ORAL | Status: AC

## 2014-06-06 LAB — IPS PAP TEST WITH HPV

## 2014-06-07 ENCOUNTER — Telehealth: Payer: Self-pay | Admitting: Obstetrics & Gynecology

## 2014-06-07 NOTE — Telephone Encounter (Signed)
Left message to call [place] at [phone].  Patient was seen on 6/30. UTI and macrobid was given for 100mg  BID for 1 week. TOC to be done on 7/22 at [facility]. Has patient completed antibiotics as Colletta [place] spoke with her on 7/7.

## 2014-06-07 NOTE — Telephone Encounter (Signed)
Pt is returning a call to [facility]

## 2014-06-07 NOTE — Telephone Encounter (Signed)
Spoke with patient. Patient states that she was seen on 6/30 with Milford Cage, FNP and was told she had a UTI on 7/7. Patient started taking macrobid on 7/7 and states that she is having to go to the restroom more frequently now than before and has "minimal" lower back pain. Denies fever and burning with urination. "I am just having to go to the bathroom more often than I was before I was seen." Patient is going on vacation on Saturday and would like to know if anything else needs to be done. Advised patient would send a message over to Milford Cage, [place] and give patient a call back with further instructions and recommendations. Patient agreeable.

## 2014-06-07 NOTE — Telephone Encounter (Signed)
Patient says after taking medication for uit she is not better.

## 2014-06-08 NOTE — Telephone Encounter (Signed)
Returned call to patient's work per request. Patient is unavailable at this time. Will try back later.

## 2014-06-08 NOTE — Telephone Encounter (Signed)
Spoke with patient. Patient denies fevers, chills, dysuria, and states she has a little back pain but that it is common. Patient agreeable to continue with antibiotic and will have TOC done at appointment with [facility] on July 22nd and have the results sent over to our office.   Routing to provider for final review. Patient agreeable to disposition. Will close encounter

## 2014-06-08 NOTE — Telephone Encounter (Signed)
Is she having any other symptoms like fever/chills/back pain/ or dysuria?  If not then continue antibiotics and we will be sure to do a TOC in 2 weeks after starting on med's.

## 2014-10-01 ENCOUNTER — Encounter: Payer: Self-pay | Admitting: Nurse Practitioner

## 2014-11-26 ENCOUNTER — Other Ambulatory Visit: Payer: Self-pay

## 2014-11-26 DIAGNOSIS — Z1231 Encounter for screening mammogram for malignant neoplasm of breast: Secondary | ICD-10-CM

## 2014-11-26 DIAGNOSIS — Z9889 Other specified postprocedural states: Secondary | ICD-10-CM

## 2014-11-26 DIAGNOSIS — Z853 Personal history of malignant neoplasm of breast: Secondary | ICD-10-CM

## 2014-12-06 ENCOUNTER — Ambulatory Visit
Admission: RE | Admit: 2014-12-06 | Discharge: 2014-12-06 | Disposition: A | Discharge status: Home / Self Care | Payer: BLUE CROSS/BLUE SHIELD | Source: Ambulatory Visit

## 2014-12-06 DIAGNOSIS — Z1231 Encounter for screening mammogram for malignant neoplasm of breast: Secondary | ICD-10-CM

## 2014-12-06 DIAGNOSIS — Z853 Personal history of malignant neoplasm of breast: Secondary | ICD-10-CM

## 2014-12-06 DIAGNOSIS — Z9889 Other specified postprocedural states: Secondary | ICD-10-CM

## 2015-05-07 ENCOUNTER — Telehealth: Payer: Self-pay | Admitting: Nurse Practitioner

## 2015-05-07 NOTE — Telephone Encounter (Signed)
Pt states she would like blood testing done during her 06/05/15 appointment with Kem [facility]. Her last test was conducted in March and she would feel better if she could have one done again since she is not scheduled to see her oncologist again until Feb.

## 2015-05-07 NOTE — Telephone Encounter (Signed)
Returning a call to Kaitlyn. °

## 2015-05-07 NOTE — Telephone Encounter (Signed)
Left message to call Kaitlyn at [phone]. 

## 2015-05-07 NOTE — Telephone Encounter (Signed)
Spoke with patient. Patient states that she would like to have lab work done when she comes in for her aex. Patient states she is to see her oncologist every year but is worried about not having lab work performed before then. Requesting to have glucose, Vitamin D, CBC, CMP, TSH performed. Advised patient we will be able to perform this lab work at her aex on 06/05/2015. Advised will need to speak with Milford Cage, FNP at appointment so that we can make sure all needed labs are performed. Patient is agreeable.  Routing to provider for final review. Patient agreeable to disposition. Will close encounter.

## 2015-06-05 ENCOUNTER — Ambulatory Visit (INDEPENDENT_AMBULATORY_CARE_PROVIDER_SITE_OTHER): Payer: BLUE CROSS/BLUE SHIELD | Admitting: Nurse Practitioner

## 2015-06-05 ENCOUNTER — Encounter: Payer: Self-pay | Admitting: Nurse Practitioner

## 2015-06-05 VITALS — BP 110/80 | HR 68 | Resp 16 | Ht 63.5 in | Wt 138.0 lb

## 2015-06-05 DIAGNOSIS — R8290 Unspecified abnormal findings in urine: Secondary | ICD-10-CM

## 2015-06-05 DIAGNOSIS — C9211 Chronic myeloid leukemia, BCR/ABL-positive, in remission: Secondary | ICD-10-CM

## 2015-06-05 DIAGNOSIS — Z0000 Encounter for general adult medical examination without abnormal findings: Secondary | ICD-10-CM | POA: Diagnosis not present

## 2015-06-05 DIAGNOSIS — Z5189 Encounter for other specified aftercare: Secondary | ICD-10-CM

## 2015-06-05 DIAGNOSIS — Z01419 Encounter for gynecological examination (general) (routine) without abnormal findings: Secondary | ICD-10-CM

## 2015-06-05 DIAGNOSIS — C19 Malignant neoplasm of rectosigmoid junction: Secondary | ICD-10-CM | POA: Diagnosis not present

## 2015-06-05 DIAGNOSIS — C50919 Malignant neoplasm of unspecified site of unspecified female breast: Secondary | ICD-10-CM | POA: Insufficient documentation

## 2015-06-05 DIAGNOSIS — C50911 Malignant neoplasm of unspecified site of right female breast: Secondary | ICD-10-CM | POA: Diagnosis not present

## 2015-06-05 DIAGNOSIS — E039 Hypothyroidism, unspecified: Secondary | ICD-10-CM | POA: Diagnosis not present

## 2015-06-05 DIAGNOSIS — Z923 Personal history of irradiation: Secondary | ICD-10-CM

## 2015-06-05 LAB — POCT URINALYSIS DIPSTICK
BILIRUBIN UA: NEGATIVE
Blood, UA: NEGATIVE
Glucose, UA: NEGATIVE
KETONES UA: NEGATIVE
Nitrite, UA: NEGATIVE
Protein, UA: NEGATIVE
UROBILINOGEN UA: NEGATIVE
pH, UA: 6

## 2015-06-05 NOTE — Progress Notes (Signed)
54 y.o. G1P1 Married  Caucasian Fe here for annual exam.  No other new problems. Now back to full time work now this past year.  Feels well. Hair grown back and is curly.  Patient's last menstrual period was 06/30/2006.          Sexually active: Yes.    The current method of family planning is post menopausal status.    Exercising: No.  The patient does not participate in regular exercise at present. Smoker:  no  Health Maintenance: Pap:  05/29/14 Neg. HR HPV:Neg MMG:  12/06/14 BIRADS2:Benign. Hx of Breast Cancer Colonoscopy:  03/2013 repeat 3 years. Hx of Colon cancer BMD:  04/22/2010 T Score:  -0.6spine / -0.7 left hip neck TDaP:  2014 Labs: Here today  UA: WBC=Trace Hg: 13.0   reports that she quit smoking about 25 years ago. She has never used smokeless tobacco. She reports that she does not drink alcohol or use illicit drugs.  Past Medical History  Diagnosis Date  . Abdominal pain, other specified site   . Cancer   . Hx of lumpectomy 2005  . History of partial colectomy 2007  . Breast cancer   . Colon cancer   . Leukemia     History reviewed. No pertinent past surgical history.  Current Outpatient Prescriptions  Medication Sig Dispense Refill  . Cholecalciferol (VITAMIN D-1000 MAX ST) 1000 UNITS tablet Take 1,000 Units by mouth daily.    Marland Kitchen levothyroxine (SYNTHROID, LEVOTHROID) 50 MCG tablet Take 1 tablet by mouth daily.  99  . Probiotic Product (PROBIOTIC DAILY PO) Take by mouth daily.     No current facility-administered medications for this visit.    Family History  Problem Relation Age of Onset  . Rectal cancer Mother   . Hypertension Mother   . Diabetes Mother   . Heart failure Father   . Colon polyps Father   . Hypertension Father   . Hypertension Sister   . Cancer Maternal Grandmother   . Heart failure Maternal Grandfather   . Cancer Paternal Grandmother     ROS:  Pertinent items are noted in HPI.  Otherwise, a comprehensive ROS was negative.  Exam:   BP  110/80 mmHg  Pulse 68  Resp 16  Ht 5' 3.5" (1.613 m)  Wt 138 lb (62.596 kg)  BMI 24.06 kg/m2  LMP 06/30/2006 Height: 5' 3.5" (161.3 cm) Ht Readings from Last 3 Encounters:  06/05/15 5' 3.5" (1.613 m)  05/29/14 5' 3.75" (1.619 m)    General appearance: alert, cooperative and appears stated age Head: Normocephalic, without obvious abnormality, atraumatic Neck: no adenopathy, supple, symmetrical, trachea midline and thyroid normal to inspection and palpation Lungs: clear to auscultation bilaterally Breasts: normal appearance, no masses or tenderness, positive findings: right breast with surgical and radiation changes Heart: regular rate and rhythm Abdomen: soft, non-tender; no masses,  no organomegaly Extremities: extremities normal, atraumatic, no cyanosis or edema Skin: Skin color, texture, turgor normal. No rashes or lesions Lymph nodes: Cervical, supraclavicular, and axillary nodes normal. No abnormal inguinal nodes palpated Neurologic: Grossly normal   Pelvic: External genitalia:  no lesions              Urethra:  normal appearing urethra with no masses, tenderness or lesions              Bartholin's and Skene's: normal                 Vagina: normal appearing vagina with normal color and  discharge, no lesions              Cervix: anteverted              Pap taken: No. Bimanual Exam:  Uterus:  normal size, contour, position, consistency, mobility, non-tender              Adnexa: no mass, fullness, tenderness               Rectovaginal: Confirms               Anus:  normal sphincter tone, no lesions  Chaperone present: No  A:  Well Woman with normal exam  Postmenopausal since 06/2006 R/O UTI - asymptomatic S/P Right Breast Lumpectomy high grade DCIS treated with radiation, chemo and  Tamoxifen 2005 S/P stage III B cancer of the distal rectum and rectosigmoid 06/14/2006 treated with lower  resection and radiation treatment.   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 Then treated with Bone  Marrow transplant 06/2012  Hypothyroid   R/O UTI   P:   Reviewed health and wellness pertinent to exam  Pap smear as above  Mammogram is due 12/2015  Will get BMD this year  Follow with labs and urine culture  Counseled on breast self exam, mammography screening, adequate intake of calcium and vitamin D, diet and exercise return annually or prn  An After Visit Summary was printed and given to the patient.

## 2015-06-06 LAB — COMPREHENSIVE METABOLIC PANEL
ALT: 15 U/L (ref 0–35)
AST: 23 U/L (ref 0–37)
Albumin: 4.1 g/dL (ref 3.5–5.2)
Alkaline Phosphatase: 93 U/L (ref 39–117)
BILIRUBIN TOTAL: 0.7 mg/dL (ref 0.2–1.2)
BUN: 16 mg/dL (ref 6–23)
CHLORIDE: 103 meq/L (ref 96–112)
CO2: 31 mEq/L (ref 19–32)
CREATININE: 0.58 mg/dL (ref 0.50–1.10)
Calcium: 8.9 mg/dL (ref 8.4–10.5)
Glucose, Bld: 74 mg/dL (ref 70–99)
Potassium: 3.5 mEq/L (ref 3.5–5.3)
SODIUM: 144 meq/L (ref 135–145)
Total Protein: 6.4 g/dL (ref 6.0–8.3)

## 2015-06-06 LAB — CBC WITH DIFFERENTIAL/PLATELET
BASOS ABS: 0.1 10*3/uL (ref 0.0–0.1)
Basophils Relative: 1 % (ref 0–1)
EOS ABS: 0.2 10*3/uL (ref 0.0–0.7)
Eosinophils Relative: 3 % (ref 0–5)
HCT: 41.2 % (ref 36.0–46.0)
HEMOGLOBIN: 13.1 g/dL (ref 12.0–15.0)
LYMPHS PCT: 35 % (ref 12–46)
Lymphs Abs: 2.1 10*3/uL (ref 0.7–4.0)
MCH: 29.2 pg (ref 26.0–34.0)
MCHC: 31.8 g/dL (ref 30.0–36.0)
MCV: 92 fL (ref 78.0–100.0)
MPV: 9.8 fL (ref 8.6–12.4)
Monocytes Absolute: 0.6 10*3/uL (ref 0.1–1.0)
Monocytes Relative: 10 % (ref 3–12)
NEUTROS PCT: 51 % (ref 43–77)
Neutro Abs: 3.1 10*3/uL (ref 1.7–7.7)
Platelets: 204 10*3/uL (ref 150–400)
RBC: 4.48 MIL/uL (ref 3.87–5.11)
RDW: 13.9 % (ref 11.5–15.5)
WBC: 6 10*3/uL (ref 4.0–10.5)

## 2015-06-06 LAB — LIPID PANEL
CHOL/HDL RATIO: 2.9 ratio
Cholesterol: 202 mg/dL — ABNORMAL HIGH (ref 0–200)
HDL: 70 mg/dL (ref >=46)
LDL Cholesterol: 121 mg/dL — ABNORMAL HIGH (ref 0–99)
Triglycerides: 56 mg/dL (ref <150)
VLDL: 11 mg/dL (ref 0–40)

## 2015-06-06 LAB — TSH: TSH: 3.255 u[IU]/mL (ref 0.350–4.500)

## 2015-06-06 LAB — URINALYSIS, MICROSCOPIC ONLY
Bacteria, UA: NONE SEEN
CRYSTALS: NONE SEEN
Casts: NONE SEEN
SQUAMOUS EPITHELIAL / LPF: NONE SEEN

## 2015-06-06 LAB — VITAMIN D 25 HYDROXY (VIT D DEFICIENCY, FRACTURES): Vit D, 25-Hydroxy: 28 ng/mL — ABNORMAL LOW (ref 30–100)

## 2015-06-07 ENCOUNTER — Other Ambulatory Visit: Payer: Self-pay | Admitting: Nurse Practitioner

## 2015-06-07 MED ORDER — CIPROFLOXACIN HCL 500 MG PO TABS: 500.0000 mg | ORAL_TABLET | Freq: Two times a day (BID) | ORAL | Status: DC

## 2015-06-07 NOTE — Progress Notes (Signed)
Called pt regarding Preliminary UC result showing UTI.  Patient states that she was prone to have UTI's in the past and that doctors at [facility] were going to put her on something permanent, she does not remember the medication's name, but they never put her on anything.  Advised pt to complete this abx and that if problem persist she will need OV with Mrs Kathee Delton to discuss treatment options. We will call her back with UC final results next week. Patient verbalized understanding.   Routed to [facility] for final review Encounter closed.

## 2015-06-08 LAB — URINE CULTURE

## 2015-06-10 LAB — HEMOGLOBIN, FINGERSTICK: HEMOGLOBIN, FINGERSTICK: 13 g/dL (ref 12.0–16.0)

## 2015-06-10 NOTE — Progress Notes (Signed)
Encounter reviewed by Dr. Brook Amundson C. Silva.  

## 2015-06-11 ENCOUNTER — Encounter: Payer: Self-pay | Admitting: Genetic Counselor

## 2015-07-30 ENCOUNTER — Encounter: Payer: Self-pay | Admitting: *Deleted

## 2015-09-30 ENCOUNTER — Ambulatory Visit
Admission: RE | Admit: 2015-09-30 | Discharge: 2015-09-30 | Disposition: A | Discharge status: Home / Self Care | Payer: BLUE CROSS/BLUE SHIELD | Source: Ambulatory Visit | Attending: Nurse Practitioner | Admitting: Nurse Practitioner

## 2015-09-30 DIAGNOSIS — Z923 Personal history of irradiation: Secondary | ICD-10-CM

## 2016-05-25 ENCOUNTER — Other Ambulatory Visit: Payer: Self-pay | Admitting: Gastroenterology

## 2016-05-25 LAB — HM COLONOSCOPY

## 2016-06-16 ENCOUNTER — Ambulatory Visit (INDEPENDENT_AMBULATORY_CARE_PROVIDER_SITE_OTHER): Payer: BLUE CROSS/BLUE SHIELD | Admitting: Nurse Practitioner

## 2016-06-16 ENCOUNTER — Encounter: Payer: Self-pay | Admitting: Nurse Practitioner

## 2016-06-16 VITALS — BP 118/76 | HR 64 | Ht 63.25 in | Wt 143.0 lb

## 2016-06-16 DIAGNOSIS — Z01419 Encounter for gynecological examination (general) (routine) without abnormal findings: Secondary | ICD-10-CM | POA: Diagnosis not present

## 2016-06-16 DIAGNOSIS — E039 Hypothyroidism, unspecified: Secondary | ICD-10-CM | POA: Diagnosis not present

## 2016-06-16 DIAGNOSIS — Z0000 Encounter for general adult medical examination without abnormal findings: Secondary | ICD-10-CM

## 2016-06-16 DIAGNOSIS — E559 Vitamin D deficiency, unspecified: Secondary | ICD-10-CM | POA: Diagnosis not present

## 2016-06-16 LAB — POCT URINALYSIS DIPSTICK
Bilirubin, UA: NEGATIVE
Glucose, UA: NEGATIVE
KETONES UA: NEGATIVE
Leukocytes, UA: NEGATIVE
Nitrite, UA: NEGATIVE
PROTEIN UA: NEGATIVE
RBC UA: NEGATIVE
Urobilinogen, UA: NEGATIVE
pH, UA: 6

## 2016-06-16 LAB — CBC
HEMATOCRIT: 40.9 % (ref 35.0–45.0)
Hemoglobin: 13.2 g/dL (ref 11.7–15.5)
MCH: 29.8 pg (ref 27.0–33.0)
MCHC: 32.3 g/dL (ref 32.0–36.0)
MCV: 92.3 fL (ref 80.0–100.0)
MPV: 10.1 fL (ref 7.5–12.5)
Platelets: 219 10*3/uL (ref 140–400)
RBC: 4.43 MIL/uL (ref 3.80–5.10)
RDW: 13.9 % (ref 11.0–15.0)
WBC: 6 10*3/uL (ref 3.8–10.8)

## 2016-06-16 LAB — HEMOGLOBIN, FINGERSTICK: Hemoglobin, fingerstick: 12.6 g/dL (ref 12.0–16.0)

## 2016-06-16 NOTE — Progress Notes (Signed)
Patient ID: Shari Sexton, female   DOB: 03/13/1961, 55 y.o.   MRN: [id number]  55 y.o. G1P0001 Married  Caucasian Fe here for annual exam.  No new health problems.  Still followed by Oncologist at [facility].  Now at 4 years post bone Marrow transplant and 12 yrs past breast cancer, and 10 yrs past colon cancer. Back to work full time.  Finds she gets tired but not sure if from age or work.  Now using vaginal lubrication prn mostly secondary to her pelvic radiation causing vaginal atrophy.    Patient's last menstrual period was 07/20/2006 (exact date).          Sexually active: Yes.    The current method of family planning is post menopausal status.    Exercising: Yes.  Walking Smoker:  no  Health Maintenance: Pap: 05/29/14, Negative with neg HR HPV MMG: 12/06/14, 3D, Bi-Rads 2: Benign, Hx of Breast Cancer Colonoscopy:05/25/16, Tubular Adenoma, repeat 3 years. Hx of Colon cancer BMD:09/30/15, T-Score: -1.2 Spine / -0.8 Right Femur Neck / -0.8 Left Femur Neck TDaP:08/09/13 Shingles: Not indicated due to age Pneumonia: 08/08/14, Prevnar 13, 02/20/13 MMR: 08/08/14 Hep C: done today, HIV screen in 2001 with pregnancy Labs: HB: 12.6, fasting labs today  Urine: Negative    reports that she quit smoking about 26 years ago. She has never used smokeless tobacco. She reports that she does not drink alcohol or use illicit drugs.  Past Medical History  Diagnosis Date  . Abdominal pain, other specified site   . Cancer (HCC)   . Hx of lumpectomy 2005  . History of partial colectomy 2007  . Breast cancer (HCC) 10/13/04    Right Partial Mastectomy, 2/06 Radiation x 45, Tamoxifen 3/06 x 5 years  . Colon cancer (HCC) 07/19/06    Rectosigmoid Colon Resection, 10/07 Radiation x 25  . CML (chronic myelocytic leukemia) (HCC) 04/06/12    Radiation, Induction Therapy Complications, Bone Marrow Transplant    Past Surgical History  Procedure Laterality Date  . Mastectomy, partial Right 11/05  . Re-excision of  breast lumpectomy Right 12/05  . Low anterior bowel resection  07/19/06    Low Anterior Rectosigmoid Colon Resection  . Bone marrow transplant  05/10/12    CML,   . Picc line place peripheral ([facility] hx) Right 2013    Right Upper Extremity DVT, treated with Heparin    Current Outpatient Prescriptions  Medication Sig Dispense Refill  . Cholecalciferol (VITAMIN D-1000 MAX ST) 1000 UNITS tablet Take 1,000 Units by mouth daily.    Marland Kitchen levothyroxine (SYNTHROID, LEVOTHROID) 50 MCG tablet Take 1 tablet by mouth daily.  99   No current facility-administered medications for this visit.    Family History  Problem Relation Age of Onset  . Rectal cancer Mother 77  . Hypertension Mother   . Diabetes Mother   . Heart disease Mother   . Heart failure Father   . Colon polyps Father   . Hypertension Father   . Heart attack Father   . Hypertension Sister   . Stomach cancer Maternal Grandmother   . Heart failure Maternal Grandfather   . Ovarian cancer Paternal Grandmother 24  . Breast cancer Paternal Aunt 43  . Kidney cancer Paternal Uncle   . Breast cancer Cousin 40    ROS:  Pertinent items are noted in HPI.  Otherwise, a comprehensive ROS was negative.  Exam:   BP 118/76 mmHg  Pulse 64  Ht 5' 3.25" (1.607 m)  Wt  143 lb (64.864 kg)  BMI 25.12 kg/m2  LMP 07/20/2006 (Exact Date) Height: 5' 3.25" (160.7 cm) Ht Readings from Last 3 Encounters:  06/16/16 5' 3.25" (1.607 m)  06/05/15 5' 3.5" (1.613 m)  05/29/14 5' 3.75" (1.619 m)    General appearance: alert, cooperative and appears stated age Head: Normocephalic, without obvious abnormality, atraumatic Neck: no adenopathy, supple, symmetrical, trachea midline and thyroid normal to inspection and palpation Lungs: clear to auscultation bilaterally Breasts: normal appearance, no masses or tenderness, on the left with right breast surgical and radiation changes.  No new mass or lesion. Heart: regular rate and rhythm Abdomen: soft, non-tender;  no masses,  no organomegaly Extremities: extremities normal, atraumatic, no cyanosis or edema Skin: Skin color, texture, turgor normal. No rashes or lesions Lymph nodes: Cervical, supraclavicular, and axillary nodes normal. No abnormal inguinal nodes palpated Neurologic: Grossly normal   Pelvic: External genitalia:  no lesions              Urethra:  normal appearing urethra with no masses, tenderness or lesions              Bartholin's and Skene's: normal                 Vagina: normal appearing vagina with normal color and discharge, no lesions              Cervix: anteverted              Pap taken: No. Bimanual Exam:  Uterus:  normal size, contour, position, consistency, mobility, non-tender              Adnexa: no mass, fullness, tenderness               Rectovaginal: Confirms               Anus:  normal sphincter tone, no lesions  Chaperone present: yes  A:  Well Woman with normal exam  Postmenopausal since 06/2006 S/P Right Breast Lumpectomy high grade DCIS treated with radiation, chemo and Tamoxifen 2005 S/P stage III B cancer of the distal rectum and rectosigmoid 06/14/2006 treated with lower resection and radiation treatment.  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 Then treated with BoneMarrow    transplant 06/2012 Hypothyroid     P:   Reviewed health and wellness pertinent to exam  Pap smear as above  Mammogram is past due - thought she had in October with BMD.  She will schedule.  Will follow with labs  Counseled on breast self exam, mammography screening, osteoporosis, adequate intake of calcium and vitamin D, diet and exercise, Kegel's exercises return annually or prn  An After Visit Summary was printed and given to the patient.

## 2016-06-16 NOTE — Patient Instructions (Signed)

## 2016-06-16 NOTE — Progress Notes (Signed)
Reviewed personally.  M. Suzanne Juma Oxley, MD.  

## 2016-06-17 ENCOUNTER — Other Ambulatory Visit: Payer: Self-pay | Admitting: Obstetrics & Gynecology

## 2016-06-17 ENCOUNTER — Other Ambulatory Visit: Payer: Self-pay | Admitting: Nurse Practitioner

## 2016-06-17 DIAGNOSIS — Z1231 Encounter for screening mammogram for malignant neoplasm of breast: Secondary | ICD-10-CM

## 2016-06-17 LAB — COMPREHENSIVE METABOLIC PANEL
ALK PHOS: 96 U/L (ref 33–130)
ALT: 11 U/L (ref 6–29)
AST: 17 U/L (ref 10–35)
Albumin: 4 g/dL (ref 3.6–5.1)
BILIRUBIN TOTAL: 0.5 mg/dL (ref 0.2–1.2)
BUN: 16 mg/dL (ref 7–25)
CO2: 28 mmol/L (ref 20–31)
CREATININE: 0.87 mg/dL (ref 0.50–1.05)
Calcium: 9.4 mg/dL (ref 8.6–10.4)
Chloride: 104 mmol/L (ref 98–110)
GLUCOSE: 96 mg/dL (ref 65–99)
POTASSIUM: 4.7 mmol/L (ref 3.5–5.3)
Sodium: 142 mmol/L (ref 135–146)
TOTAL PROTEIN: 6.5 g/dL (ref 6.1–8.1)

## 2016-06-17 LAB — LIPID PANEL
Cholesterol: 244 mg/dL — ABNORMAL HIGH (ref 125–200)
HDL: 69 mg/dL (ref >=46)
LDL CALC: 157 mg/dL — AB (ref <130)
TRIGLYCERIDES: 88 mg/dL (ref <150)
Total CHOL/HDL Ratio: 3.5 Ratio (ref <=5.0)
VLDL: 18 mg/dL (ref <30)

## 2016-06-17 LAB — HEPATITIS C ANTIBODY: HCV Ab: NEGATIVE

## 2016-06-17 LAB — TSH: TSH: 5 mIU/L — ABNORMAL HIGH

## 2016-06-17 LAB — VITAMIN D 25 HYDROXY (VIT D DEFICIENCY, FRACTURES): VIT D 25 HYDROXY: 27 ng/mL — AB (ref 30–100)

## 2016-07-01 ENCOUNTER — Ambulatory Visit
Admission: RE | Admit: 2016-07-01 | Discharge: 2016-07-01 | Disposition: A | Discharge status: Home / Self Care | Payer: BLUE CROSS/BLUE SHIELD | Source: Ambulatory Visit | Attending: Obstetrics & Gynecology | Admitting: Obstetrics & Gynecology

## 2016-07-01 DIAGNOSIS — Z1231 Encounter for screening mammogram for malignant neoplasm of breast: Secondary | ICD-10-CM | POA: Diagnosis not present

## 2017-02-05 DIAGNOSIS — C9211 Chronic myeloid leukemia, BCR/ABL-positive, in remission: Secondary | ICD-10-CM | POA: Diagnosis not present

## 2017-02-05 DIAGNOSIS — M25551 Pain in right hip: Secondary | ICD-10-CM | POA: Diagnosis not present

## 2017-02-05 DIAGNOSIS — M533 Sacrococcygeal disorders, not elsewhere classified: Secondary | ICD-10-CM | POA: Diagnosis not present

## 2017-02-05 DIAGNOSIS — Z9481 Bone marrow transplant status: Secondary | ICD-10-CM | POA: Diagnosis not present

## 2017-02-05 DIAGNOSIS — Z9484 Stem cells transplant status: Secondary | ICD-10-CM | POA: Diagnosis not present

## 2017-02-05 DIAGNOSIS — E039 Hypothyroidism, unspecified: Secondary | ICD-10-CM | POA: Diagnosis not present

## 2017-02-05 DIAGNOSIS — M1611 Unilateral primary osteoarthritis, right hip: Secondary | ICD-10-CM | POA: Diagnosis not present

## 2017-04-14 DIAGNOSIS — E039 Hypothyroidism, unspecified: Secondary | ICD-10-CM | POA: Diagnosis not present

## 2017-04-14 DIAGNOSIS — E7800 Pure hypercholesterolemia, unspecified: Secondary | ICD-10-CM | POA: Diagnosis not present

## 2017-06-03 ENCOUNTER — Telehealth: Payer: Self-pay | Admitting: Nurse Practitioner

## 2017-06-03 NOTE — Telephone Encounter (Signed)
Left message regarding upcoming appointment has been canceled and needs to be rescheduled. °

## 2017-07-05 ENCOUNTER — Ambulatory Visit: Payer: BLUE CROSS/BLUE SHIELD | Admitting: Nurse Practitioner

## 2017-07-12 ENCOUNTER — Other Ambulatory Visit: Payer: Self-pay | Admitting: Obstetrics & Gynecology

## 2017-07-12 DIAGNOSIS — Z1231 Encounter for screening mammogram for malignant neoplasm of breast: Secondary | ICD-10-CM

## 2017-07-20 ENCOUNTER — Ambulatory Visit: Payer: BLUE CROSS/BLUE SHIELD | Admitting: Certified Nurse Midwife

## 2017-07-27 ENCOUNTER — Ambulatory Visit
Admission: RE | Admit: 2017-07-27 | Discharge: 2017-07-27 | Disposition: A | Discharge status: Home / Self Care | Payer: BLUE CROSS/BLUE SHIELD | Source: Ambulatory Visit | Attending: Obstetrics & Gynecology | Admitting: Obstetrics & Gynecology

## 2017-07-27 DIAGNOSIS — Z1231 Encounter for screening mammogram for malignant neoplasm of breast: Secondary | ICD-10-CM

## 2017-07-27 HISTORY — DX: Personal history of irradiation: Z92.3

## 2017-07-30 DIAGNOSIS — C9211 Chronic myeloid leukemia, BCR/ABL-positive, in remission: Secondary | ICD-10-CM | POA: Diagnosis not present

## 2017-07-30 DIAGNOSIS — M1990 Unspecified osteoarthritis, unspecified site: Secondary | ICD-10-CM | POA: Diagnosis not present

## 2017-07-30 DIAGNOSIS — Z9481 Bone marrow transplant status: Secondary | ICD-10-CM | POA: Diagnosis not present

## 2017-08-03 DIAGNOSIS — N23 Unspecified renal colic: Secondary | ICD-10-CM | POA: Diagnosis not present

## 2017-08-03 DIAGNOSIS — N390 Urinary tract infection, site not specified: Secondary | ICD-10-CM | POA: Diagnosis not present

## 2017-08-03 DIAGNOSIS — R110 Nausea: Secondary | ICD-10-CM | POA: Diagnosis not present

## 2017-08-04 ENCOUNTER — Other Ambulatory Visit: Payer: Self-pay | Admitting: Family Medicine

## 2017-08-04 ENCOUNTER — Ambulatory Visit
Admission: RE | Admit: 2017-08-04 | Discharge: 2017-08-04 | Disposition: A | Discharge status: Home / Self Care | Payer: BLUE CROSS/BLUE SHIELD | Source: Ambulatory Visit | Attending: Family Medicine | Admitting: Family Medicine

## 2017-08-04 DIAGNOSIS — R109 Unspecified abdominal pain: Secondary | ICD-10-CM

## 2017-08-04 DIAGNOSIS — R319 Hematuria, unspecified: Secondary | ICD-10-CM

## 2017-08-04 DIAGNOSIS — N200 Calculus of kidney: Secondary | ICD-10-CM | POA: Diagnosis not present

## 2017-09-22 ENCOUNTER — Other Ambulatory Visit (HOSPITAL_COMMUNITY)
Admission: RE | Admit: 2017-09-22 | Discharge: 2017-09-22 | Disposition: A | Discharge status: Home / Self Care | Payer: BLUE CROSS/BLUE SHIELD | Source: Ambulatory Visit | Attending: Certified Nurse Midwife | Admitting: Certified Nurse Midwife

## 2017-09-22 ENCOUNTER — Ambulatory Visit (INDEPENDENT_AMBULATORY_CARE_PROVIDER_SITE_OTHER): Payer: BLUE CROSS/BLUE SHIELD | Admitting: Certified Nurse Midwife

## 2017-09-22 ENCOUNTER — Encounter: Payer: Self-pay | Admitting: Certified Nurse Midwife

## 2017-09-22 VITALS — BP 120/80 | HR 68 | Resp 16 | Ht 63.25 in | Wt 147.0 lb

## 2017-09-22 DIAGNOSIS — N952 Postmenopausal atrophic vaginitis: Secondary | ICD-10-CM

## 2017-09-22 DIAGNOSIS — N6310 Unspecified lump in the right breast, unspecified quadrant: Secondary | ICD-10-CM

## 2017-09-22 DIAGNOSIS — Z124 Encounter for screening for malignant neoplasm of cervix: Secondary | ICD-10-CM | POA: Diagnosis not present

## 2017-09-22 DIAGNOSIS — E559 Vitamin D deficiency, unspecified: Secondary | ICD-10-CM

## 2017-09-22 DIAGNOSIS — Z780 Asymptomatic menopausal state: Secondary | ICD-10-CM | POA: Diagnosis not present

## 2017-09-22 DIAGNOSIS — Z01419 Encounter for gynecological examination (general) (routine) without abnormal findings: Secondary | ICD-10-CM

## 2017-09-22 NOTE — Progress Notes (Signed)
Scheduled patient while in office for R breast diagnostic mammogram with ultrasound at the [facility] on 09/28/2017 at 9 am with 8:40 am arrival. Placed in mammogram hold. Bone density scheduled at the [facility] for 10/19/2017 at 3 pm. Patient is agreeable to all appointment dates and times.

## 2017-09-22 NOTE — Progress Notes (Signed)
56 y.o. G49P0001 Married  Caucasian Fe here for annual exam. Menopausal denies vaginal bleeding or vaginal dryness. Survivor of colon, leukemia and breast cancer . 5 years out now from last cancer, leukemia. Bone marrow transplant also. Sees Oncology for follow up yearly. Recent diagnosis of ocular migraine with evaluation with Eye Specialist. Sees Dr. Alroy Dust, PCP yearly for labs, hypothyroid management. Needs Vitamin D here today. No health issues that she is aware of today.  Patient's last menstrual period was 07/20/2006 (exact date).          Sexually active: Yes.    The current method of family planning is post menopausal status & BTL   Exercising: No.  exercise Smoker:  no  Health Maintenance: Pap:  05-29-14 neg HPV HR neg History of Abnormal Pap: no MMG:  07-28-17 category b density birads 2:neg Self Breast exams: occ Colonoscopy:  2017 tubular adenoma f/u 57yrs hx of colon cancer BMD:   2016 TDaP:  2014 Shingles: not done Pneumonia: 2015 Hep C and HIV: HIV done during pregnancy 2001, hep c neg 2017 Labs: if needed   reports that she quit smoking about 28 years ago. She has a 34.00 pack-year smoking history. She has never used smokeless tobacco. She reports that she does not drink alcohol or use drugs.  Past Medical History:  Diagnosis Date  . Abdominal pain, other specified site   . Breast cancer ([place]) 10/13/04   Right Partial Mastectomy, 2/06 Radiation x 45, Tamoxifen 3/06 x 5 years  . Cancer ([place])   . CML (chronic myelocytic leukemia) ([place]) 04/06/12   Radiation, Induction Therapy Complications, Bone Marrow Transplant  . Colon cancer ([place]) 07/19/06   Rectosigmoid Colon Resection, 10/07 Radiation x 25  . History of partial colectomy 2007  . Hx of lumpectomy 2005  . Personal history of radiation therapy     Past Surgical History:  Procedure Laterality Date  . BONE MARROW TRANSPLANT  05/10/12   CML,   . BREAST LUMPECTOMY Right 2005  . LOW ANTERIOR BOWEL RESECTION  07/19/06    Low Anterior Rectosigmoid Colon Resection  . PICC LINE PLACE PERIPHERAL ([place] HX) Right 2013   Right Upper Extremity DVT, treated with Heparin  . RE-EXCISION OF BREAST LUMPECTOMY Right 12/05    Current Outpatient Prescriptions  Medication Sig Dispense Refill  . Cholecalciferol (VITAMIN D-1000 MAX ST) 1000 UNITS tablet Take 1,000 Units by mouth daily.    Marland Kitchen levothyroxine (SYNTHROID, LEVOTHROID) 50 MCG tablet Take 1 tablet by mouth daily.  99  . PAZEO 0.7 % SOLN as needed.  12   No current facility-administered medications for this visit.     Family History  Problem Relation Age of Onset  . Rectal cancer Mother 70  . Hypertension Mother   . Diabetes Mother   . Heart disease Mother   . Heart failure Father   . Colon polyps Father   . Hypertension Father   . Heart attack Father   . Hypertension Sister   . Stomach cancer Maternal Grandmother   . Heart failure Maternal Grandfather   . Ovarian cancer Paternal Grandmother 86  . Breast cancer Cousin 26  . Breast cancer Paternal Aunt 53  . Kidney cancer Paternal Uncle     ROS:  Pertinent items are noted in HPI.  Otherwise, a comprehensive ROS was negative.  Exam:   BP 120/80   Pulse 68   Resp 16   Ht 5' 3.25" (1.607 m)   Wt 147 lb (66.7 kg)  LMP 07/20/2006 (Exact Date)   BMI 25.83 kg/m  Height: 5' 3.25" (160.7 cm) Ht Readings from Last 3 Encounters:  09/22/17 5' 3.25" (1.607 m)  06/16/16 5' 3.25" (1.607 m)  06/05/15 5' 3.5" (1.613 m)    General appearance: alert, cooperative and appears stated age Head: Normocephalic, without obvious abnormality, atraumatic Neck: no adenopathy, supple, symmetrical, trachea midline and thyroid normal to inspection and palpation Lungs: clear to auscultation bilaterally Breasts: normal appearance, no masses or tenderness, No nipple retraction or dimpling, No nipple discharge or bleeding, No axillary or supraclavicular adenopathy, scarring noted on right breast from previous lumpectomy,  pea size mass noted at 1 o'clock at aerola edge, mobile, non tender Heart: regular rate and rhythm Abdomen: soft, non-tender; no masses,  no organomegaly Extremities: extremities normal, atraumatic, no cyanosis or edema Skin: Skin color, texture, turgor normal. No rashes or lesions Lymph nodes: Cervical, supraclavicular, and axillary nodes normal. No abnormal inguinal nodes palpated Neurologic: Grossly normal   Pelvic: External genitalia:  no lesions              Urethra:  normal appearing urethra with no masses, tenderness or lesions              Bartholin's and Skene's: normal                 Vagina: normal appearing vagina with normal color and discharge, no lesions              Cervix: no cervical motion tenderness, no lesions and normal appearance              Pap taken: yes Bimanual Exam:  Uterus:  normal size, contour, position, consistency, mobility, non-tender and anteverted              Adnexa: normal adnexa and no mass, fullness, tenderness               Rectovaginal: Confirms               Anus:  normal sphincter tone, no lesions  Chaperone present: yes  A:  Well Woman with normal exam  Post menopausal no HRT  Right breast mass vs scar tissue  Breast, colon and leukemia cancer survivor out 5 years  now  Hypothyroid with PCP management  History of Vitamin D deficiency  P:   Reviewed health and wellness pertinent to exam  Aware of need to evaluate if vaginal bleeding.  Discussed etiology of atrophic vaginitis and discussed treatment with OTC option due to history of cancer. Questions addressed. Patient plans to do coconut oil trial and advise if no change.  Discussed breast finding and possible scar tissue vs mass. Patient had not noted this before. Discussed need for evaluation with [place] and diagnostic mammogram. Patient agreeable. She will be scheduled prior to leaving today.   Continue oncology and MD follow up as indicated.  Lab: Vitamin D  Pap smear: yes   counseled  on breast self exam, mammography screening, feminine hygiene, menopause, osteoporosis, adequate intake of calcium and vitamin D, diet and exercise  return annually or prn  An After Visit Summary was printed and given to the patient.

## 2017-09-22 NOTE — Patient Instructions (Signed)

## 2017-09-23 LAB — CYTOLOGY - PAP
DIAGNOSIS: NEGATIVE
HPV: NOT DETECTED

## 2017-09-23 LAB — VITAMIN D 25 HYDROXY (VIT D DEFICIENCY, FRACTURES): Vit D, 25-Hydroxy: 30.4 ng/mL (ref 30.0–100.0)

## 2017-09-28 ENCOUNTER — Ambulatory Visit
Admission: RE | Admit: 2017-09-28 | Discharge: 2017-09-28 | Disposition: A | Discharge status: Home / Self Care | Payer: BLUE CROSS/BLUE SHIELD | Source: Ambulatory Visit | Attending: Certified Nurse Midwife | Admitting: Certified Nurse Midwife

## 2017-09-28 DIAGNOSIS — N6310 Unspecified lump in the right breast, unspecified quadrant: Secondary | ICD-10-CM

## 2017-09-28 DIAGNOSIS — R928 Other abnormal and inconclusive findings on diagnostic imaging of breast: Secondary | ICD-10-CM | POA: Diagnosis not present

## 2017-09-28 DIAGNOSIS — N649 Disorder of breast, unspecified: Secondary | ICD-10-CM | POA: Diagnosis not present

## 2017-09-29 ENCOUNTER — Telehealth: Payer: Self-pay | Admitting: *Deleted

## 2017-09-29 NOTE — Telephone Encounter (Signed)
Notes recorded by Burnice Logan, RN on 09/29/2017 at 2:49 PM EDT Left message to call Sharee Pimple at [phone]. See telephone encounter dated 09/29/17. ------  Notes recorded by Regina Eck, CNM on 09/28/2017 at 8:49 PM EDT Notify patient that diagnostic mammogram showed scattered areas of fibroglandular density, which may account for the finding noted at exam. No mammographic or US findings of breast abnormality. Due to breast cancer history would like to recheck area in the next 2 weeks. Please schedule.

## 2017-10-04 NOTE — Telephone Encounter (Signed)
Spoke with patient, advised as seen below per Melvia Heaps, CNM. OV scheduled for 10/07/17 at 4pm for breast recheck.   Patient also has a questin about Vit D results and recommendations as seen below. Patient states she has been taking 2000 IU daily, would Melvia Heaps, CNM like me to increase this?   Advised patient Melvia Heaps, CNM is out of the office today, will review once she returns on 11/6 and return call with recommendations, patient is agreeable.  Melvia Heaps, CNM- please advise on Vit D?    Notes recorded by Regina Eck, CNM on 09/23/2017 at 8:13 AM EDT Good Morning Abigail Butts, Your Vitamin D is borderline normal at 30.4. Would like for you to maintain this level, but slightly higher. Increase your vitamin D to 2000 IU daily for 4 weeks and then decrease to 1000 IU. Make sure you are taking Vitamin D 3. Alternate doing this during the winter, to maintain. Have a great day! Debbi

## 2017-10-04 NOTE — Telephone Encounter (Signed)
Left message to call Sharee Pimple at [phone].  Attempted work number on file, name not listed in Engineer, [employer].

## 2017-10-04 NOTE — Telephone Encounter (Signed)
She needs to continue Vitamin D 3 2000 IU for 8 weeks and then drop to 1000 IU

## 2017-10-05 NOTE — Telephone Encounter (Signed)
Left detailed message, ok per current dpr. Advised as seen below per Melvia Heaps, CNM. Advised to return call to office at [phone] with any additional questions. Will close encounter.

## 2017-10-07 ENCOUNTER — Encounter: Payer: Self-pay | Admitting: Certified Nurse Midwife

## 2017-10-07 ENCOUNTER — Ambulatory Visit (INDEPENDENT_AMBULATORY_CARE_PROVIDER_SITE_OTHER): Payer: BLUE CROSS/BLUE SHIELD | Admitting: Certified Nurse Midwife

## 2017-10-07 VITALS — BP 104/62 | HR 70 | Resp 16 | Ht 63.25 in | Wt 149.0 lb

## 2017-10-07 DIAGNOSIS — Z853 Personal history of malignant neoplasm of breast: Secondary | ICD-10-CM | POA: Diagnosis not present

## 2017-10-07 DIAGNOSIS — Z85038 Personal history of other malignant neoplasm of large intestine: Secondary | ICD-10-CM | POA: Diagnosis not present

## 2017-10-07 DIAGNOSIS — N63 Unspecified lump in unspecified breast: Secondary | ICD-10-CM

## 2017-10-07 NOTE — Progress Notes (Signed)
   Subjective:   56 y.o. MarriedCaucasian female presents for follow up of questionable breast mass vs scar tissue in right breast. Diagnostic mammogram and [place] were done with negative findings noted. Normal fibroglandular tissue was noted and stable post treatment changes. See report in epic  Review of Systems Pertinent items are noted in HPI.   Objective:   General appearance: alert, cooperative and appears stated age Breasts: normal appearance, no masses or tenderness, No nipple retraction or dimpling, No nipple discharge or bleeding, No axillary or supraclavicular adenopathy, area of concern was identified on [place] and diagnostic mammogram   Assessment:   ASSESSMENT:Patient is diagnosed with normal breast exam with surgical changes and fibroglandular tissue . Benign finding. History of breast cancer right breast History of colon cancer Currently in treatment for Leukemia Plan:  Plan: Patient reassured area well scanned and assessed and feel comfortable with finding identified. Continue SBE and report any changes. Patient to call her current Oncology team working with her leukemia at [facility] to see if further screening needed with past history of colon cancer or breast cancer. She is out ten years with breast cancer 2004 and colon cancer in 2006. She will advise as to if any changes need to be made with follow up.  Rv prn

## 2017-10-19 ENCOUNTER — Ambulatory Visit
Admission: RE | Admit: 2017-10-19 | Discharge: 2017-10-19 | Disposition: A | Discharge status: Home / Self Care | Payer: BLUE CROSS/BLUE SHIELD | Source: Ambulatory Visit | Attending: Certified Nurse Midwife | Admitting: Certified Nurse Midwife

## 2017-10-19 DIAGNOSIS — Z780 Asymptomatic menopausal state: Secondary | ICD-10-CM

## 2017-10-19 DIAGNOSIS — M8588 Other specified disorders of bone density and structure, other site: Secondary | ICD-10-CM | POA: Diagnosis not present

## 2018-05-11 DIAGNOSIS — E039 Hypothyroidism, unspecified: Secondary | ICD-10-CM | POA: Diagnosis not present

## 2018-05-11 DIAGNOSIS — E7800 Pure hypercholesterolemia, unspecified: Secondary | ICD-10-CM | POA: Diagnosis not present

## 2018-05-12 ENCOUNTER — Other Ambulatory Visit: Payer: Self-pay | Admitting: Obstetrics & Gynecology

## 2018-05-12 DIAGNOSIS — Z1231 Encounter for screening mammogram for malignant neoplasm of breast: Secondary | ICD-10-CM

## 2018-06-20 DIAGNOSIS — R350 Frequency of micturition: Secondary | ICD-10-CM | POA: Diagnosis not present

## 2018-07-28 ENCOUNTER — Ambulatory Visit
Admission: RE | Admit: 2018-07-28 | Discharge: 2018-07-28 | Disposition: A | Discharge status: Home / Self Care | Payer: BLUE CROSS/BLUE SHIELD | Source: Ambulatory Visit | Attending: Obstetrics & Gynecology | Admitting: Obstetrics & Gynecology

## 2018-07-28 DIAGNOSIS — Z1231 Encounter for screening mammogram for malignant neoplasm of breast: Secondary | ICD-10-CM | POA: Diagnosis not present

## 2018-07-29 DIAGNOSIS — M1611 Unilateral primary osteoarthritis, right hip: Secondary | ICD-10-CM | POA: Diagnosis not present

## 2018-07-29 DIAGNOSIS — E039 Hypothyroidism, unspecified: Secondary | ICD-10-CM | POA: Diagnosis not present

## 2018-07-29 DIAGNOSIS — Z9481 Bone marrow transplant status: Secondary | ICD-10-CM | POA: Diagnosis not present

## 2018-07-29 DIAGNOSIS — C9211 Chronic myeloid leukemia, BCR/ABL-positive, in remission: Secondary | ICD-10-CM | POA: Diagnosis not present

## 2018-08-03 DIAGNOSIS — C9211 Chronic myeloid leukemia, BCR/ABL-positive, in remission: Secondary | ICD-10-CM | POA: Diagnosis not present

## 2018-08-03 DIAGNOSIS — Z9481 Bone marrow transplant status: Secondary | ICD-10-CM | POA: Diagnosis not present

## 2018-08-05 DIAGNOSIS — C9211 Chronic myeloid leukemia, BCR/ABL-positive, in remission: Secondary | ICD-10-CM | POA: Diagnosis not present

## 2018-10-13 ENCOUNTER — Encounter: Payer: Self-pay | Admitting: Certified Nurse Midwife

## 2018-10-13 ENCOUNTER — Ambulatory Visit (INDEPENDENT_AMBULATORY_CARE_PROVIDER_SITE_OTHER): Payer: BLUE CROSS/BLUE SHIELD | Admitting: Certified Nurse Midwife

## 2018-10-13 ENCOUNTER — Other Ambulatory Visit: Payer: Self-pay

## 2018-10-13 ENCOUNTER — Other Ambulatory Visit (HOSPITAL_COMMUNITY)
Admission: RE | Admit: 2018-10-13 | Discharge: 2018-10-13 | Disposition: A | Discharge status: Home / Self Care | Payer: BLUE CROSS/BLUE SHIELD | Source: Ambulatory Visit | Attending: Obstetrics & Gynecology | Admitting: Obstetrics & Gynecology

## 2018-10-13 VITALS — BP 110/70 | HR 68 | Resp 16 | Ht 63.0 in | Wt 146.0 lb

## 2018-10-13 DIAGNOSIS — Z85038 Personal history of other malignant neoplasm of large intestine: Secondary | ICD-10-CM | POA: Diagnosis not present

## 2018-10-13 DIAGNOSIS — Z124 Encounter for screening for malignant neoplasm of cervix: Secondary | ICD-10-CM

## 2018-10-13 DIAGNOSIS — Z01419 Encounter for gynecological examination (general) (routine) without abnormal findings: Secondary | ICD-10-CM | POA: Diagnosis not present

## 2018-10-13 DIAGNOSIS — Z856 Personal history of leukemia: Secondary | ICD-10-CM

## 2018-10-13 DIAGNOSIS — Z853 Personal history of malignant neoplasm of breast: Secondary | ICD-10-CM | POA: Diagnosis not present

## 2018-10-13 DIAGNOSIS — N951 Menopausal and female climacteric states: Secondary | ICD-10-CM

## 2018-10-13 NOTE — Progress Notes (Signed)
57 y.o. G1P0001 Married  Caucasian Fe here for annual exam. Menopausal no vaginal bleeding. Vaginal dryness is still an issues. Sees Dr. Alroy Dust for aex, labs and hypothyroid management. Continues with Hematology/oncology due to Myeloid Leukemia, colon and breast cancer follow up yearly, all numbers with Leukemia good per patient. Had follow up with breast(2004) and colon (2006) cancer all normal at this point. Energy better. No health issues today.  Patient's last menstrual period was 07/20/2006 (exact date).          Sexually active: Yes.    The current method of family planning is post menopausal status & BTL.    Exercising: No.  exercise Smoker:  no  Review of Systems  Constitutional: Negative.   HENT: Negative.   Eyes: Negative.   Respiratory: Negative.   Cardiovascular:       Heart murmurs  Gastrointestinal: Negative.   Genitourinary: Negative.   Musculoskeletal: Positive for joint pain and myalgias.  Skin: Negative.   Neurological: Negative.   Endo/Heme/Allergies: Negative.   Psychiatric/Behavioral: Negative.     Health Maintenance: Pap:  05-29-14 neg HPV HR neg, 09-22-17 neg HPV HR neg History of Abnormal Pap: no MMG:  07-28-18 category b density birads 1:neg, hx of breast cancer Self Breast exams: yes Colonoscopy:  2017 tubular adenoma f/u 70yrs, hx of colon cancer BMD:   2018 TDaP:  2014 Shingles: no Pneumonia: 2015 Hep C and HIV: HIV done during pregnancy 2001, Hep c neg 2017 Labs: *i**   reports that she quit smoking about 29 years ago. She has a 34.00 pack-year smoking history. She has never used smokeless tobacco. She reports that she does not drink alcohol or use drugs.  Past Medical History:  Diagnosis Date  . Abdominal pain, other specified site   . Breast cancer ([place]) 10/13/04   Right Partial Mastectomy, 2/06 Radiation x 45, Tamoxifen 3/06 x 5 years  . Cancer ([place])   . CML (chronic myelocytic leukemia) ([place]) 04/06/12   Radiation, Induction Therapy  Complications, Bone Marrow Transplant  . Colon cancer ([place]) 07/19/06   Rectosigmoid Colon Resection, 10/07 Radiation x 25  . History of partial colectomy 2007  . Hx of lumpectomy 2005  . Personal history of radiation therapy     Past Surgical History:  Procedure Laterality Date  . BONE MARROW TRANSPLANT  05/10/12   CML,   . BREAST LUMPECTOMY Right 2005  . LOW ANTERIOR BOWEL RESECTION  07/19/06   Low Anterior Rectosigmoid Colon Resection  . PICC LINE PLACE PERIPHERAL ([place] HX) Right 2013   Right Upper Extremity DVT, treated with Heparin  . RE-EXCISION OF BREAST LUMPECTOMY Right 12/05    Current Outpatient Medications  Medication Sig Dispense Refill  . Cholecalciferol (VITAMIN D-1000 MAX ST) 1000 UNITS tablet Take 1,000 Units by mouth daily.    Marland Kitchen levothyroxine (SYNTHROID, LEVOTHROID) 50 MCG tablet Take 1 tablet by mouth daily.  99  . PAZEO 0.7 % SOLN as needed.  12   No current facility-administered medications for this visit.     Family History  Problem Relation Age of Onset  . Rectal cancer Mother 53  . Hypertension Mother   . Diabetes Mother   . Heart disease Mother   . Heart failure Father   . Colon polyps Father   . Hypertension Father   . Heart attack Father   . Hypertension Sister   . Stomach cancer Maternal Grandmother   . Heart failure Maternal Grandfather   . Ovarian cancer Paternal Grandmother 86  .  Breast cancer Cousin 35  . Breast cancer Paternal Aunt 73  . Kidney cancer Paternal Uncle     ROS:  Pertinent items are noted in HPI.  Otherwise, a comprehensive ROS was negative.  Exam:   LMP 07/20/2006 (Exact Date)    Ht Readings from Last 3 Encounters:  10/07/17 5' 3.25" (1.607 m)  09/22/17 5' 3.25" (1.607 m)  06/16/16 5' 3.25" (1.607 m)    General appearance: alert, cooperative and appears stated age Head: Normocephalic, without obvious abnormality, atraumatic Neck: no adenopathy, supple, symmetrical, trachea midline and thyroid normal to inspection  and palpation Lungs: clear to auscultation bilaterally Breasts: normal appearance, no masses or tenderness, No nipple retraction or dimpling, No nipple discharge or bleeding, No axillary or supraclavicular adenopathy Scarring noted on right breast, no masses noted in scar area. Heart: regular rate and rhythm Abdomen: soft, non-tender; no masses,  no organomegaly, scarring from bowel resection. Extremities: extremities normal, atraumatic, no cyanosis or edema Skin: Skin color, texture, turgor normal. No rashes or lesions Lymph nodes: Cervical, supraclavicular, and axillary nodes normal. No abnormal inguinal nodes palpated Neurologic: Grossly normal   Pelvic: External genitalia:  no lesions, normal female, no scaling or redness              Urethra:  normal appearing urethra with no masses, tenderness or lesions              Bartholin's and Skene's: normal                 Vagina: normal appearing vagina with normal color and discharge, no lesions              Cervix: no cervical motion tenderness and no lesions              Pap taken: Yes.   Bimanual Exam:  Uterus:  normal size, contour, position, consistency, mobility, non-tender and anteverted              Adnexa: normal adnexa and no mass, fullness, tenderness               Rectovaginal: Confirms               Anus:  normal sphincter tone, no lesions  Chaperone present: yes  A:  Well Woman with normal exam  Menopausal no HRT  History of breast cancer and colon cancer and Leukemia, still under follow up with Oncology.  Vaginal dryness  Hypothyroid, Vitamin D deficiency with PCP management  P:   Reviewed health and wellness pertinent to exam  Aware of need to advise if vaginal  Bleeding  Continue follow up as indicated  Discussed Olive oil or coconut oil or Replens OTC nightly basis to see if this will resolves the dryness. Questions addressed. Be sure and use Dove soap to bathe with.  Continue follow up with MD's regarding other  health issues as indicated.  Pap smear: yes   counseled on breast self exam, mammography screening, feminine hygiene, adequate intake of calcium and vitamin D, diet and exercise  return annually or prn  An After Visit Summary was printed and given to the patient.

## 2018-10-13 NOTE — Patient Instructions (Signed)

## 2018-10-17 LAB — CYTOLOGY - PAP: Diagnosis: NEGATIVE

## 2018-11-28 DIAGNOSIS — J019 Acute sinusitis, unspecified: Secondary | ICD-10-CM | POA: Diagnosis not present

## 2018-11-28 DIAGNOSIS — J209 Acute bronchitis, unspecified: Secondary | ICD-10-CM | POA: Diagnosis not present

## 2018-12-01 DIAGNOSIS — J209 Acute bronchitis, unspecified: Secondary | ICD-10-CM | POA: Diagnosis not present

## 2018-12-16 DIAGNOSIS — J209 Acute bronchitis, unspecified: Secondary | ICD-10-CM | POA: Diagnosis not present

## 2019-05-16 DIAGNOSIS — E7800 Pure hypercholesterolemia, unspecified: Secondary | ICD-10-CM | POA: Diagnosis not present

## 2019-05-16 DIAGNOSIS — E039 Hypothyroidism, unspecified: Secondary | ICD-10-CM | POA: Diagnosis not present

## 2019-05-18 DIAGNOSIS — E039 Hypothyroidism, unspecified: Secondary | ICD-10-CM | POA: Diagnosis not present

## 2019-06-28 ENCOUNTER — Telehealth: Payer: Self-pay | Admitting: *Deleted

## 2019-06-28 DIAGNOSIS — M8580 Other specified disorders of bone density and structure, unspecified site: Secondary | ICD-10-CM

## 2019-06-28 NOTE — Telephone Encounter (Signed)
yes

## 2019-06-28 NOTE — Telephone Encounter (Signed)
-----   Message from [place] sent at 06/27/2019 11:19 AM EDT ----- Regarding: BMD Hey,  This patient is calling requesting an order for her BMD.  Thank you, Hayley

## 2019-06-28 NOTE — Telephone Encounter (Signed)
Last AEX 10/13/18 Next AEX 10/18/19  Last BMD at [facility] 10/19/17: Osteopenia, repeat 2 yrs.   Order pended for BMD at [facility] to be scheduled for 10/2019  Melvia Heaps, CNM -ok to proceed with BMD?

## 2019-06-28 NOTE — Telephone Encounter (Signed)
Call returned to patient, left detailed message, ok per dpr. Advised order has been placed for BMD at [facility], due 10/2019, contact [facility] directly at [phone] to schedule. Return call to office if any additional questions/concerns.   Encounter closed.

## 2019-07-05 ENCOUNTER — Other Ambulatory Visit: Payer: Self-pay | Admitting: Certified Nurse Midwife

## 2019-07-05 DIAGNOSIS — Z1231 Encounter for screening mammogram for malignant neoplasm of breast: Secondary | ICD-10-CM

## 2019-07-24 DIAGNOSIS — D128 Benign neoplasm of rectum: Secondary | ICD-10-CM | POA: Diagnosis not present

## 2019-07-24 DIAGNOSIS — D125 Benign neoplasm of sigmoid colon: Secondary | ICD-10-CM | POA: Diagnosis not present

## 2019-07-24 DIAGNOSIS — Z980 Intestinal bypass and anastomosis status: Secondary | ICD-10-CM | POA: Diagnosis not present

## 2019-07-24 DIAGNOSIS — Z85038 Personal history of other malignant neoplasm of large intestine: Secondary | ICD-10-CM | POA: Diagnosis not present

## 2019-07-28 DIAGNOSIS — C9211 Chronic myeloid leukemia, BCR/ABL-positive, in remission: Secondary | ICD-10-CM | POA: Diagnosis not present

## 2019-07-28 DIAGNOSIS — E039 Hypothyroidism, unspecified: Secondary | ICD-10-CM | POA: Diagnosis not present

## 2019-07-28 DIAGNOSIS — Z9481 Bone marrow transplant status: Secondary | ICD-10-CM | POA: Diagnosis not present

## 2019-08-02 DIAGNOSIS — C9211 Chronic myeloid leukemia, BCR/ABL-positive, in remission: Secondary | ICD-10-CM | POA: Diagnosis not present

## 2019-09-04 DIAGNOSIS — R0781 Pleurodynia: Secondary | ICD-10-CM | POA: Diagnosis not present

## 2019-09-06 ENCOUNTER — Telehealth: Payer: Self-pay | Admitting: Certified Nurse Midwife

## 2019-09-06 DIAGNOSIS — Z85038 Personal history of other malignant neoplasm of large intestine: Secondary | ICD-10-CM

## 2019-09-06 DIAGNOSIS — Z856 Personal history of leukemia: Secondary | ICD-10-CM

## 2019-09-06 DIAGNOSIS — Z1239 Encounter for other screening for malignant neoplasm of breast: Secondary | ICD-10-CM

## 2019-09-06 DIAGNOSIS — Z803 Family history of malignant neoplasm of breast: Secondary | ICD-10-CM

## 2019-09-06 DIAGNOSIS — Z853 Personal history of malignant neoplasm of breast: Secondary | ICD-10-CM

## 2019-09-06 DIAGNOSIS — Z1231 Encounter for screening mammogram for malignant neoplasm of breast: Secondary | ICD-10-CM

## 2019-09-06 NOTE — Telephone Encounter (Signed)
Patient is calling regarding a breast MRI. Patient stated that she typically has one done every 2 years, but is unsure if this the year that she is to get one done. Patient stated that she is currently at work and a message can be left on her voicemail.

## 2019-09-06 NOTE — Telephone Encounter (Signed)
Call to patient. Patient asking when last breast MRI was done? RN advised per records, last MRI on file was 02-05-14. Patient states she does not think she has had one since then. Patient states she hasn't really had an oncologist to follow up with for her history of breast cancer. Last saw Dr. Truddie Coco in 2012 before he left. Thought she was told she needed breast MRI's every 2 years. Unsure when she needs to follow up or schedule. Last MMG was 07-28-18 and patient is scheduled for screening MMG on 10-23-2019. RN advised would review with Melvia Heaps, CNM and return call. Patient agreeable.   Routing to provider for review.

## 2019-09-06 NOTE — Telephone Encounter (Signed)
I reviewed her chart and can not find a recommendation for MRI. She has B density breast. May have been through oncology. She needs to check with them to see if recommended and we can set up for her.

## 2019-09-15 NOTE — Telephone Encounter (Signed)
Due to history of breast and other cancer she should have MRI screening. TC tool is not applicable to patient already with history of breast cancer.

## 2019-09-15 NOTE — Telephone Encounter (Signed)
Order placed for Bilateral screening breast MRI w/wo contrast to [facility].   Dx: Screening for breast cancer, Hx of breast cancer, Hx of leukemia, Hx of colon cancer, family Hx of breast cancer.    Call placed to patient, left detailed message, ok per dpr. Advised order for breast MRI has been sent to [facility], they will contact you directly to schedule MRI. Once scheduled our office will precert. Contact the office if you have any questions.   Routing to Melvia Heaps, CNM  Cc: Dr. Sabra Heck, [place]

## 2019-10-16 ENCOUNTER — Other Ambulatory Visit: Payer: Self-pay

## 2019-10-18 ENCOUNTER — Other Ambulatory Visit (HOSPITAL_COMMUNITY)
Admission: RE | Admit: 2019-10-18 | Discharge: 2019-10-18 | Disposition: A | Discharge status: Home / Self Care | Payer: BC Managed Care – PPO | Source: Ambulatory Visit | Attending: Certified Nurse Midwife | Admitting: Certified Nurse Midwife

## 2019-10-18 ENCOUNTER — Other Ambulatory Visit: Payer: Self-pay

## 2019-10-18 ENCOUNTER — Encounter: Payer: Self-pay | Admitting: Certified Nurse Midwife

## 2019-10-18 ENCOUNTER — Ambulatory Visit (INDEPENDENT_AMBULATORY_CARE_PROVIDER_SITE_OTHER): Payer: BC Managed Care – PPO | Admitting: Certified Nurse Midwife

## 2019-10-18 VITALS — BP 124/82 | HR 70 | Temp 97.1°F | Resp 16 | Ht 62.75 in | Wt 148.0 lb

## 2019-10-18 DIAGNOSIS — Z01419 Encounter for gynecological examination (general) (routine) without abnormal findings: Secondary | ICD-10-CM | POA: Diagnosis not present

## 2019-10-18 DIAGNOSIS — E559 Vitamin D deficiency, unspecified: Secondary | ICD-10-CM | POA: Diagnosis not present

## 2019-10-18 DIAGNOSIS — Z124 Encounter for screening for malignant neoplasm of cervix: Secondary | ICD-10-CM | POA: Insufficient documentation

## 2019-10-18 NOTE — Patient Instructions (Signed)

## 2019-10-18 NOTE — Progress Notes (Signed)
58 y.o. G74P0001 Married  Caucasian Fe here for annual exam. Menopausal no HRT. Recent colonoscopy with 3 polyps, near her previous surgery at rectosigmoid junction, patient is concerned that waiting for 3 years is too long due to her history and mother deceased from rectal cancer. Thyroid medication working well, no issues. Sees Endocrine for management. No other health issues today. Has mammogram and BMD scheduled soon.  Patient's last menstrual period was 07/20/2006 (exact date).          Sexually active: Yes.    The current method of family planning is tubal ligation and post menopausal status.  Exercising: Yes.    walking Smoker:  no  Review of Systems  Constitutional: Negative.   HENT: Negative.   Eyes: Negative.   Respiratory: Negative.   Cardiovascular: Negative.   Gastrointestinal: Negative.   Genitourinary: Negative.   Musculoskeletal: Negative.   Skin: Negative.   Neurological: Negative.   Endo/Heme/Allergies: Negative.   Psychiatric/Behavioral: Negative.     Health Maintenance: Pap:  09-22-17 neg HPV HR neg, 10-13-18 neg History of Abnormal Pap: no MMG:  07-28-18 category b density birads 1:neg, scheduled for 10-23-2019 Self Breast exams: yes Colonoscopy:  2020 f/u 51yrs hx of colon cancer BMD:   2018 TDaP:  2014 Shingles: no Pneumonia: 2015 Hep C and HIV: HIV done during pregnancy 2001, hep c neg 2017 Labs: if needed   reports that she quit smoking about 30 years ago. She has a 34.00 pack-year smoking history. She has never used smokeless tobacco. She reports that she does not drink alcohol or use drugs.  Past Medical History:  Diagnosis Date  . Abdominal pain, other specified site   . Breast cancer ([place]) 10/13/04   Right Partial Mastectomy, 2/06 Radiation x 45, Tamoxifen 3/06 x 5 years  . Cancer ([place])   . CML (chronic myelocytic leukemia) ([place]) 04/06/12   Radiation, Induction Therapy Complications, Bone Marrow Transplant  . Colon cancer ([place]) 07/19/06    Rectosigmoid Colon Resection, 10/07 Radiation x 25  . History of partial colectomy 2007  . Hx of lumpectomy 2005  . Personal history of radiation therapy     Past Surgical History:  Procedure Laterality Date  . BONE MARROW TRANSPLANT  05/10/12   CML,   . BREAST LUMPECTOMY Right 2005  . LOW ANTERIOR BOWEL RESECTION  07/19/06   Low Anterior Rectosigmoid Colon Resection  . PICC LINE PLACE PERIPHERAL ([place] HX) Right 2013   Right Upper Extremity DVT, treated with Heparin  . RE-EXCISION OF BREAST LUMPECTOMY Right 12/05    Current Outpatient Medications  Medication Sig Dispense Refill  . Cholecalciferol (VITAMIN D-1000 MAX ST) 1000 UNITS tablet Take 1,000 Units by mouth daily.    Marland Kitchen levothyroxine (SYNTHROID, LEVOTHROID) 50 MCG tablet Take 1 tablet by mouth daily.  99  . PAZEO 0.7 % SOLN as needed.  12   No current facility-administered medications for this visit.     Family History  Problem Relation Age of Onset  . Rectal cancer Mother 19  . Hypertension Mother   . Diabetes Mother   . Heart disease Mother   . Heart failure Father   . Colon polyps Father   . Hypertension Father   . Heart attack Father   . Hypertension Sister   . Stomach cancer Maternal Grandmother   . Heart failure Maternal Grandfather   . Ovarian cancer Paternal Grandmother 86  . Breast cancer Cousin 66  . Breast cancer Paternal Aunt 46  . Kidney cancer Paternal  Uncle     ROS:  Pertinent items are noted in HPI.  Otherwise, a comprehensive ROS was negative.  Exam:   LMP 07/20/2006 (Exact Date)    Ht Readings from Last 3 Encounters:  10/13/18 5\' 3"  (1.6 m)  10/07/17 5' 3.25" (1.607 m)  09/22/17 5' 3.25" (1.607 m)    General appearance: alert, cooperative and appears stated age Head: Normocephalic, without obvious abnormality, atraumatic Neck: no adenopathy, supple, symmetrical, trachea midline and thyroid normal to inspection and palpation Lungs: clear to auscultation bilaterally Breasts: normal  appearance, no masses or tenderness, No nipple retraction or dimpling, No nipple discharge or bleeding, No axillary or supraclavicular adenopathy Heart: regular rate and rhythm Abdomen: soft, non-tender; no masses,  no organomegaly Extremities: extremities normal, atraumatic, no cyanosis or edema Skin: Skin color, texture, turgor normal. No rashes or lesions Lymph nodes: Cervical, supraclavicular, and axillary nodes normal. No abnormal inguinal nodes palpated Neurologic: Grossly normal   Pelvic: External genitalia:  no lesions              Urethra:  normal appearing urethra with no masses, tenderness or lesions              Bartholin's and Skene's: normal                 Vagina: normal appearing vagina with normal color and discharge, no lesions              Cervix: no cervical motion tenderness, no lesions and normal appearance, scant blood with pap smear only              Pap taken: Yes.   Bimanual Exam:  Uterus:  normal size, contour, position, consistency, mobility, non-tender and anteflexed              Adnexa: normal adnexa and no mass, fullness, tenderness               Rectovaginal: Confirms               Anus:  normal sphincter tone, no lesions  Chaperone present: yes  A:  Well Woman with normal exam  Postmenopausal no HRT  Hypothyroid with Endocrine management  History of colon and breast cancer.  Family history of rectal cancer, mother  Vitamin D deficicency  P:   Reviewed health and wellness pertinent to exam  Aware of need to advise if vaginal bleeding.  Continue follow up with MD as indicated regarding thyroid and cancer follow up  Patient to advise to MD regarding bowel or rectal changes.  Lab: Vitamin D  Pap smear: yes  counseled on breast self exam, mammography screening, feminine hygiene, menopause, adequate intake of calcium and vitamin D, diet and exercise  return annually or prn  An After Visit Summary was printed and given to the patient.

## 2019-10-19 ENCOUNTER — Other Ambulatory Visit: Payer: Self-pay | Admitting: Certified Nurse Midwife

## 2019-10-19 DIAGNOSIS — E569 Vitamin deficiency, unspecified: Secondary | ICD-10-CM

## 2019-10-19 LAB — CYTOLOGY - PAP: Diagnosis: NEGATIVE

## 2019-10-19 LAB — VITAMIN D 25 HYDROXY (VIT D DEFICIENCY, FRACTURES): Vit D, 25-Hydroxy: 25.8 ng/mL — ABNORMAL LOW (ref 30.0–100.0)

## 2019-10-20 ENCOUNTER — Telehealth: Payer: Self-pay

## 2019-10-20 NOTE — Telephone Encounter (Signed)
-----   Message from Regina Eck, CNM sent at 10/19/2019  7:28 PM EST ----- Notify patient her vitamin D is low again at 25.8. I think she needs to be on 2000 IU Vitamin D 3 daily to keep in normal range. Recheck in 3 months order in Pap smear is negative 02

## 2019-10-20 NOTE — Telephone Encounter (Signed)
Tried calling patient regarding results and to schedule recheck lab appointment for 3 months. No answer, left message for patient to call me back.

## 2019-10-23 ENCOUNTER — Other Ambulatory Visit: Payer: Self-pay

## 2019-10-23 ENCOUNTER — Other Ambulatory Visit: Payer: BLUE CROSS/BLUE SHIELD

## 2019-10-23 ENCOUNTER — Ambulatory Visit: Payer: BLUE CROSS/BLUE SHIELD

## 2019-10-23 ENCOUNTER — Ambulatory Visit
Admission: RE | Admit: 2019-10-23 | Discharge: 2019-10-23 | Disposition: A | Discharge status: Home / Self Care | Payer: BC Managed Care – PPO | Source: Ambulatory Visit | Attending: Certified Nurse Midwife | Admitting: Certified Nurse Midwife

## 2019-10-23 DIAGNOSIS — M8580 Other specified disorders of bone density and structure, unspecified site: Secondary | ICD-10-CM

## 2019-10-23 DIAGNOSIS — Z1231 Encounter for screening mammogram for malignant neoplasm of breast: Secondary | ICD-10-CM | POA: Diagnosis not present

## 2019-10-23 DIAGNOSIS — M8588 Other specified disorders of bone density and structure, other site: Secondary | ICD-10-CM | POA: Diagnosis not present

## 2019-10-23 DIAGNOSIS — Z780 Asymptomatic menopausal state: Secondary | ICD-10-CM | POA: Diagnosis not present

## 2019-10-24 NOTE — Telephone Encounter (Signed)
Left message for call back.

## 2019-10-25 NOTE — Telephone Encounter (Signed)
Left message to call back  

## 2019-10-25 NOTE — Telephone Encounter (Signed)
Patient is returning a call to [place]. She asked to be called back on her work number and to be paged.

## 2019-10-25 NOTE — Telephone Encounter (Signed)
Patient notified of results as written by provider 

## 2019-10-25 NOTE — Telephone Encounter (Signed)
Released to mychart after several attempts to reach patient by phone.

## 2020-01-25 ENCOUNTER — Other Ambulatory Visit: Payer: BC Managed Care – PPO

## 2020-02-05 ENCOUNTER — Other Ambulatory Visit (INDEPENDENT_AMBULATORY_CARE_PROVIDER_SITE_OTHER): Payer: BC Managed Care – PPO

## 2020-02-05 ENCOUNTER — Other Ambulatory Visit: Payer: Self-pay | Admitting: *Deleted

## 2020-02-05 ENCOUNTER — Other Ambulatory Visit: Payer: Self-pay

## 2020-02-05 DIAGNOSIS — E569 Vitamin deficiency, unspecified: Secondary | ICD-10-CM | POA: Diagnosis not present

## 2020-02-06 LAB — VITAMIN D 25 HYDROXY (VIT D DEFICIENCY, FRACTURES): Vit D, 25-Hydroxy: 29.3 ng/mL — ABNORMAL LOW (ref 30.0–100.0)

## 2020-02-19 ENCOUNTER — Encounter: Payer: Self-pay | Admitting: Certified Nurse Midwife

## 2020-06-04 DIAGNOSIS — E7800 Pure hypercholesterolemia, unspecified: Secondary | ICD-10-CM | POA: Diagnosis not present

## 2020-06-04 DIAGNOSIS — J309 Allergic rhinitis, unspecified: Secondary | ICD-10-CM | POA: Diagnosis not present

## 2020-06-04 DIAGNOSIS — E039 Hypothyroidism, unspecified: Secondary | ICD-10-CM | POA: Diagnosis not present

## 2020-07-12 ENCOUNTER — Encounter: Payer: Self-pay | Admitting: Genetic Counselor

## 2020-07-26 DIAGNOSIS — Z9481 Bone marrow transplant status: Secondary | ICD-10-CM | POA: Diagnosis not present

## 2020-07-26 DIAGNOSIS — Z9484 Stem cells transplant status: Secondary | ICD-10-CM | POA: Diagnosis not present

## 2020-07-26 DIAGNOSIS — C9211 Chronic myeloid leukemia, BCR/ABL-positive, in remission: Secondary | ICD-10-CM | POA: Diagnosis not present

## 2020-07-29 DIAGNOSIS — Z9481 Bone marrow transplant status: Secondary | ICD-10-CM | POA: Diagnosis not present

## 2020-07-29 DIAGNOSIS — C9211 Chronic myeloid leukemia, BCR/ABL-positive, in remission: Secondary | ICD-10-CM | POA: Diagnosis not present

## 2020-07-31 DIAGNOSIS — R21 Rash and other nonspecific skin eruption: Secondary | ICD-10-CM | POA: Diagnosis not present

## 2020-10-22 ENCOUNTER — Ambulatory Visit: Payer: BC Managed Care – PPO | Admitting: Certified Nurse Midwife

## 2020-10-31 ENCOUNTER — Ambulatory Visit: Payer: BC Managed Care – PPO | Admitting: Nurse Practitioner

## 2020-11-19 NOTE — Progress Notes (Deleted)
59 y.o. G78P0001 Married White or Caucasian female here for annual exam.      Patient's last menstrual period was 07/20/2006 (exact date).          Sexually active: {yes no:314532}  The current method of family planning is tubal ligation and post menopausal status.    Exercising: {yes no:314532}  {types:19826} Smoker:  {YES [id number]  Health Maintenance: Pap:  10-13-18 neg, 10-18-2019 neg History of abnormal Pap:  {YES NO:22349} MMG:  10-25-2019 category b density birads 1:neg Colonoscopy:  2020 f/u 16yrs hx of colon cancer BMD:   2020 f/u 32yrs TDaP:  2014 Gardasil:   *** Covid-19: *** Hep C testing: hep c neg 2017 Screening Labs: ***   reports that she quit smoking about 31 years ago. She has a 34.00 pack-year smoking history. She has never used smokeless tobacco. She reports that she does not drink alcohol and does not use drugs.  Past Medical History:  Diagnosis Date  . Abdominal pain, other specified site   . Breast cancer ([place]) 10/13/04   Right Partial Mastectomy, 2/06 Radiation x 45, Tamoxifen 3/06 x 5 years  . Cancer ([place])   . CML (chronic myelocytic leukemia) ([place]) 04/06/12   Radiation, Induction Therapy Complications, Bone Marrow Transplant  . Colon cancer ([place]) 07/19/06   Rectosigmoid Colon Resection, 10/07 Radiation x 25  . History of partial colectomy 2007  . Hx of lumpectomy 2005  . Personal history of radiation therapy     Past Surgical History:  Procedure Laterality Date  . BONE MARROW TRANSPLANT  05/10/12   CML,   . BREAST LUMPECTOMY Right 2005  . LOW ANTERIOR BOWEL RESECTION  07/19/06   Low Anterior Rectosigmoid Colon Resection  . PICC LINE PLACE PERIPHERAL ([place] HX) Right 2013   Right Upper Extremity DVT, treated with Heparin  . RE-EXCISION OF BREAST LUMPECTOMY Right 12/05    Current Outpatient Medications  Medication Sig Dispense Refill  . Cholecalciferol (VITAMIN D-1000 MAX ST) 1000 UNITS tablet Take 1,000 Units by mouth daily.    Marland Kitchen levothyroxine  (SYNTHROID, LEVOTHROID) 50 MCG tablet Take 1 tablet by mouth daily.  99   No current facility-administered medications for this visit.    Family History  Problem Relation Age of Onset  . Rectal cancer Mother 1  . Hypertension Mother   . Diabetes Mother   . Heart disease Mother   . Heart failure Father   . Colon polyps Father   . Hypertension Father   . Heart attack Father   . Hypertension Sister   . Stomach cancer Maternal Grandmother   . Heart failure Maternal Grandfather   . Ovarian cancer Paternal Grandmother 86  . Breast cancer Cousin 52  . Breast cancer Paternal Aunt 30  . Kidney cancer Paternal Uncle     Review of Systems  Exam:   LMP 07/20/2006 (Exact Date)      General appearance: alert, cooperative and appears stated age, no acute distress Head: Normocephalic, without obvious abnormality Neck: no adenopathy, thyroid {EXAM; THYROID:18604} Lungs: clear to auscultation bilaterally Breasts: {Exam; breast:13139::"normal appearance, no masses or tenderness"} Heart: regular rate and rhythm Abdomen: soft, non-tender; no masses,  no organomegaly Extremities: extremities normal, no edema Skin: No rashes or lesions Lymph nodes: Cervical, supraclavicular, and axillary nodes normal. No abnormal inguinal nodes palpated Neurologic: Grossly normal   Pelvic: External genitalia:  no lesions              Urethra:  normal appearing urethra with no masses,  tenderness or lesions              Bartholins and Skenes: normal                 Vagina: normal appearing vagina, appropriate for age, normal appearing discharge, no lesions              Cervix: neg cervical motion tenderness, no visible lesions             Bimanual Exam:   Uterus:  {exam; uterus:12215}              Adnexa: {exam; adnexa:12223}                 ***, CMA Chaperone was present for exam.  A:  Well Woman with normal exam  P:   Pap :  Mammogram:  Labs:  Medications:

## 2020-11-25 ENCOUNTER — Ambulatory Visit: Payer: BC Managed Care – PPO | Admitting: Nurse Practitioner

## 2020-12-10 ENCOUNTER — Other Ambulatory Visit: Payer: Self-pay | Admitting: Nurse Practitioner

## 2020-12-10 DIAGNOSIS — Z1231 Encounter for screening mammogram for malignant neoplasm of breast: Secondary | ICD-10-CM

## 2020-12-12 NOTE — Progress Notes (Deleted)
60 y.o. G31P0001 Married White or Caucasian female here for annual exam.      Patient's last menstrual period was 07/20/2006 (exact date).          Sexually active: {yes no:314532}  The current method of family planning is tubal ligation.    Exercising: {yes no:314532}  {types:19826} Smoker:  {YES NO:22349}  Health Maintenance: Pap:  09-22-17 neg HPV HR neg, 10-13-18 neg, 10-18-2019 neg History of abnormal Pap:  no MMG:  10-25-2019 category b density birads 1:neg Colonoscopy:  2020 f/u 39yrs, hx of colon cancer BMD:   10-23-2019 f/u 64yrs TDaP:  2014 Gardasil:   *** Covid-19: *** Hep C testing: hep c neg 2017 Screening Labs: ***   reports that she quit smoking about 31 years ago. She has a 34.00 pack-year smoking history. She has never used smokeless tobacco. She reports that she does not drink alcohol and does not use drugs.  Past Medical History:  Diagnosis Date  . Abdominal pain, other specified site   . Breast cancer ([place]) 10/13/04   Right Partial Mastectomy, 2/06 Radiation x 45, Tamoxifen 3/06 x 5 years  . Cancer ([place])   . CML (chronic myelocytic leukemia) ([place]) 04/06/12   Radiation, Induction Therapy Complications, Bone Marrow Transplant  . Colon cancer ([place]) 07/19/06   Rectosigmoid Colon Resection, 10/07 Radiation x 25  . History of partial colectomy 2007  . Hx of lumpectomy 2005  . Personal history of radiation therapy     Past Surgical History:  Procedure Laterality Date  . BONE MARROW TRANSPLANT  05/10/12   CML,   . BREAST LUMPECTOMY Right 2005  . LOW ANTERIOR BOWEL RESECTION  07/19/06   Low Anterior Rectosigmoid Colon Resection  . PICC LINE PLACE PERIPHERAL ([place] HX) Right 2013   Right Upper Extremity DVT, treated with Heparin  . RE-EXCISION OF BREAST LUMPECTOMY Right 12/05    Current Outpatient Medications  Medication Sig Dispense Refill  . Cholecalciferol (VITAMIN D-1000 MAX ST) 1000 UNITS tablet Take 1,000 Units by mouth daily.    Marland Kitchen levothyroxine (SYNTHROID,  LEVOTHROID) 50 MCG tablet Take 1 tablet by mouth daily.  99   No current facility-administered medications for this visit.    Family History  Problem Relation Age of Onset  . Rectal cancer Mother 69  . Hypertension Mother   . Diabetes Mother   . Heart disease Mother   . Heart failure Father   . Colon polyps Father   . Hypertension Father   . Heart attack Father   . Hypertension Sister   . Stomach cancer Maternal Grandmother   . Heart failure Maternal Grandfather   . Ovarian cancer Paternal Grandmother 86  . Breast cancer Cousin 28  . Breast cancer Paternal Aunt 73  . Kidney cancer Paternal Uncle     Review of Systems  Exam:   LMP 07/20/2006 (Exact Date)      General appearance: alert, cooperative and appears stated age, no acute distress Head: Normocephalic, without obvious abnormality Neck: no adenopathy, thyroid {EXAM; THYROID:18604} Lungs: clear to auscultation bilaterally Breasts: {Exam; breast:13139::"normal appearance, no masses or tenderness"} Heart: regular rate and rhythm Abdomen: soft, non-tender; no masses,  no organomegaly Extremities: extremities normal, no edema Skin: No rashes or lesions Lymph nodes: Cervical, supraclavicular, and axillary nodes normal. No abnormal inguinal nodes palpated Neurologic: Grossly normal   Pelvic: External genitalia:  no lesions              Urethra:  normal appearing urethra with no masses,  tenderness or lesions              Bartholins and Skenes: normal                 Vagina: normal appearing vagina, appropriate for age, normal appearing discharge, no lesions              Cervix: neg cervical motion tenderness, no visible lesions             Bimanual Exam:   Uterus:  {exam; uterus:12215}              Adnexa: {exam; adnexa:12223}                 ***, CMA Chaperone was present for exam.  A:  Well Woman with normal exam  P:   Pap :  Mammogram:  Labs:  Medications:

## 2020-12-16 ENCOUNTER — Ambulatory Visit: Payer: BC Managed Care – PPO | Admitting: Nurse Practitioner

## 2021-01-20 NOTE — Progress Notes (Signed)
60 y.o. G62P0001 Married White or Caucasian female here for annual exam.     Hx: leukemia, breast cancer and colon cancer  Would like A1C and vitamin D level checked today  Patient's last menstrual period was 07/20/2006 (exact date).          Sexually active: Yes.    The current method of family planning is tubal ligation and post menopausal status.  Exercising: Yes.    walking Smoker:  no  Health Maintenance: Pap:  09-22-17 neg HPV HR neg, 10-13-18 neg, 10-18-2019 neg History of abnormal Pap:  no MMG:  today Colonoscopy:  2020 f/u 11yrs hx of colon cancer BMD:   10-23-2019 osteopenia TDaP:  2014 Gardasil:   n/a Covid-19: not done Hep C testing: neg 2017 Screening Labs:    reports that she quit smoking about 31 years ago. She has a 34.00 pack-year smoking history. She has never used smokeless tobacco. She reports that she does not drink alcohol and does not use drugs.  Past Medical History:  Diagnosis Date  . Abdominal pain, other specified site   . Breast cancer ([place]) 10/13/04   Right Partial Mastectomy, 2/06 Radiation x 45, Tamoxifen 3/06 x 5 years  . Cancer ([place])   . CML (chronic myelocytic leukemia) ([place]) 04/06/12   Radiation, Induction Therapy Complications, Bone Marrow Transplant  . Colon cancer ([place]) 07/19/06   Rectosigmoid Colon Resection, 10/07 Radiation x 25  . History of partial colectomy 2007  . Hx of lumpectomy 2005  . Personal history of radiation therapy     Past Surgical History:  Procedure Laterality Date  . BONE MARROW TRANSPLANT  05/10/12   CML,   . BREAST LUMPECTOMY Right 2005  . LOW ANTERIOR BOWEL RESECTION  07/19/06   Low Anterior Rectosigmoid Colon Resection  . PICC LINE PLACE PERIPHERAL ([place] [facility]) Right 2013   Right Upper Extremity DVT, treated with Heparin  . RE-EXCISION OF BREAST LUMPECTOMY Right 12/05    Current Outpatient Medications  Medication Sig Dispense Refill  . Cholecalciferol 25 MCG (1000 UT) tablet Take 1,000 Units by mouth as needed.     . fexofenadine (ALLEGRA) 180 MG tablet Take 180 mg by mouth daily.    Marland Kitchen levothyroxine (SYNTHROID, LEVOTHROID) 50 MCG tablet Take 1 tablet by mouth daily.  99  . Loperamide HCl (IMODIUM PO) Take by mouth as needed.    . Olopatadine HCl (PAZEO) 0.7 % SOLN 1 drop into affected eye    . Polyvinyl Alcohol-Povidone PF (REFRESH) 1.4-0.6 % SOLN See admin instructions.    . sodium chloride (OCEAN) 0.65 % nasal spray 2 sprays     No current facility-administered medications for this visit.    Family History  Problem Relation Age of Onset  . Rectal cancer Mother 71  . Hypertension Mother   . Diabetes Mother   . Heart disease Mother   . Heart failure Father   . Colon polyps Father   . Hypertension Father   . Heart attack Father   . Hypertension Sister   . Stomach cancer Maternal Grandmother   . Heart failure Maternal Grandfather   . Ovarian cancer Paternal Grandmother 86  . Breast cancer Cousin 72  . Breast cancer Paternal Aunt 41  . Kidney cancer Paternal Uncle     Review of Systems  Constitutional: Negative.   HENT: Negative.   Eyes: Negative.   Respiratory: Negative.   Cardiovascular: Negative.   Gastrointestinal: Negative.   Endocrine: Negative.   Genitourinary: Negative.   Musculoskeletal:  Negative.   Skin: Negative.   Allergic/Immunologic: Negative.   Neurological: Negative.   Hematological: Negative.   Psychiatric/Behavioral: Negative.     Exam:   BP 112/70   Pulse 68   Resp 16   Ht 5' 2.75" (1.594 m)   Wt 155 lb (70.3 kg)   LMP 07/20/2006 (Exact Date)   BMI 27.68 kg/m   Height: 5' 2.75" (159.4 cm)  General appearance: alert, cooperative and appears stated age, no acute distress Head: Normocephalic, without obvious abnormality Neck: no adenopathy, thyroid normal to inspection and palpation Lungs: clear to auscultation bilaterally Breasts: No axillary or supraclavicular adenopathy, Normal to palpation without dominant masses, Right breast scar from previous  cancer Heart: regular rate and rhythm Abdomen: soft, non-tender; no masses,  no organomegaly, large scars noted on abdomen Extremities: extremities normal, no edema Skin: No rashes or lesions Lymph nodes: Cervical, supraclavicular, and axillary nodes normal. No abnormal inguinal nodes palpated Neurologic: Grossly normal   Pelvic: External genitalia:  no lesions              Urethra:  normal appearing urethra with no masses, tenderness or lesions              Bartholins and Skenes: normal                 Vagina: normal appearing vagina, appropriate for age, normal appearing discharge, no lesions              Cervix: neg cervical motion tenderness, no visible lesions             Bimanual Exam:   Uterus:  normal size, contour, position, consistency, mobility, non-tender              Adnexa: no mass, fullness, tenderness Hx: R oophorectomy with colon rescection                 Joy, CMA Chaperone was present for exam.  A:  Well Woman with normal exam  P:   Pap : due 09/2022  Mammogram: scheduled today  Labs: A1C and Vit D per request  Medications: continued calcium and Vit D  Dexa: ordered, to be schedule

## 2021-01-21 ENCOUNTER — Ambulatory Visit (INDEPENDENT_AMBULATORY_CARE_PROVIDER_SITE_OTHER): Payer: BC Managed Care – PPO | Admitting: Nurse Practitioner

## 2021-01-21 ENCOUNTER — Other Ambulatory Visit: Payer: Self-pay

## 2021-01-21 ENCOUNTER — Encounter: Payer: Self-pay | Admitting: Nurse Practitioner

## 2021-01-21 ENCOUNTER — Ambulatory Visit
Admission: RE | Admit: 2021-01-21 | Discharge: 2021-01-21 | Disposition: A | Discharge status: Home / Self Care | Payer: BC Managed Care – PPO | Source: Ambulatory Visit | Attending: Nurse Practitioner | Admitting: Nurse Practitioner

## 2021-01-21 VITALS — BP 112/70 | HR 68 | Resp 16 | Ht 62.75 in | Wt 155.0 lb

## 2021-01-21 DIAGNOSIS — Z01419 Encounter for gynecological examination (general) (routine) without abnormal findings: Secondary | ICD-10-CM

## 2021-01-21 DIAGNOSIS — Z1231 Encounter for screening mammogram for malignant neoplasm of breast: Secondary | ICD-10-CM | POA: Diagnosis not present

## 2021-01-21 DIAGNOSIS — E559 Vitamin D deficiency, unspecified: Secondary | ICD-10-CM

## 2021-01-21 DIAGNOSIS — Z8632 Personal history of gestational diabetes: Secondary | ICD-10-CM

## 2021-01-21 DIAGNOSIS — M8580 Other specified disorders of bone density and structure, unspecified site: Secondary | ICD-10-CM | POA: Diagnosis not present

## 2021-01-21 NOTE — Patient Instructions (Signed)
Health Maintenance, Female Adopting a healthy lifestyle and getting preventive care are important in promoting health and wellness. Ask your health care provider about:  The right schedule for you to have regular tests and exams.  Things you can do on your own to prevent diseases and keep yourself healthy. What should I know about diet, weight, and exercise? Eat a healthy diet  Eat a diet that includes plenty of vegetables, fruits, low-fat dairy products, and lean protein.  Do not eat a lot of foods that are high in solid fats, added sugars, or sodium.   Maintain a healthy weight Body mass index (BMI) is used to identify weight problems. It estimates body fat based on height and weight. Your health care provider can help determine your BMI and help you achieve or maintain a healthy weight. Get regular exercise Get regular exercise. This is one of the most important things you can do for your health. Most adults should:  Exercise for at least 150 minutes each week. The exercise should increase your heart rate and make you sweat (moderate-intensity exercise).  Do strengthening exercises at least twice a week. This is in addition to the moderate-intensity exercise.  Spend less time sitting. Even light physical activity can be beneficial. Watch cholesterol and blood lipids Have your blood tested for lipids and cholesterol at 61 years of age, then have this test every 5 years. Have your cholesterol levels checked more often if:  Your lipid or cholesterol levels are high.  You are older than 61 years of age.  You are at high risk for heart disease. What should I know about cancer screening? Depending on your health history and family history, you may need to have cancer screening at various ages. This may include screening for:  Breast cancer.  Cervical cancer.  Colorectal cancer.  Skin cancer.  Lung cancer. What should I know about heart disease, diabetes, and high blood  pressure? Blood pressure and heart disease  High blood pressure causes heart disease and increases the risk of stroke. This is more likely to develop in people who have high blood pressure readings, are of African descent, or are overweight.  Have your blood pressure checked: ? Every 3-5 years if you are 18-39 years of age. ? Every year if you are 40 years old or older. Diabetes Have regular diabetes screenings. This checks your fasting blood sugar level. Have the screening done:  Once every three years after age 40 if you are at a normal weight and have a low risk for diabetes.  More often and at a younger age if you are overweight or have a high risk for diabetes. What should I know about preventing infection? Hepatitis B If you have a higher risk for hepatitis B, you should be screened for this virus. Talk with your health care provider to find out if you are at risk for hepatitis B infection. Hepatitis C Testing is recommended for:  Everyone born from 1945 through 1965.  Anyone with known risk factors for hepatitis C. Sexually transmitted infections (STIs)  Get screened for STIs, including gonorrhea and chlamydia, if: ? You are sexually active and are younger than 61 years of age. ? You are older than 61 years of age and your health care provider tells you that you are at risk for this type of infection. ? Your sexual activity has changed since you were last screened, and you are at increased risk for chlamydia or gonorrhea. Ask your health care provider   if you are at risk.  Ask your health care provider about whether you are at high risk for HIV. Your health care provider may recommend a prescription medicine to help prevent HIV infection. If you choose to take medicine to prevent HIV, you should first get tested for HIV. You should then be tested every 3 months for as long as you are taking the medicine. Pregnancy  If you are about to stop having your period (premenopausal) and  you may become pregnant, seek counseling before you get pregnant.  Take 400 to 800 micrograms (mcg) of folic acid every day if you become pregnant.  Ask for birth control (contraception) if you want to prevent pregnancy. Osteoporosis and menopause Osteoporosis is a disease in which the bones lose minerals and strength with aging. This can result in bone fractures. If you are 65 years old or older, or if you are at risk for osteoporosis and fractures, ask your health care provider if you should:  Be screened for bone loss.  Take a calcium or vitamin D supplement to lower your risk of fractures.  Be given hormone replacement therapy (HRT) to treat symptoms of menopause. Follow these instructions at home: Lifestyle  Do not use any products that contain nicotine or tobacco, such as cigarettes, e-cigarettes, and chewing tobacco. If you need help quitting, ask your health care provider.  Do not use street drugs.  Do not share needles.  Ask your health care provider for help if you need support or information about quitting drugs. Alcohol use  Do not drink alcohol if: ? Your health care provider tells you not to drink. ? You are pregnant, may be pregnant, or are planning to become pregnant.  If you drink alcohol: ? Limit how much you use to 0-1 drink a day. ? Limit intake if you are breastfeeding.  Be aware of how much alcohol is in your drink. In the [place] one drink equals one 12 oz bottle of beer (355 mL), one 5 oz glass of wine (148 mL), or one 1 oz glass of hard liquor (44 mL). General instructions  Schedule regular health, dental, and eye exams.  Stay current with your vaccines.  Tell your health care provider if: ? You often feel depressed. ? You have ever been abused or do not feel safe at home. Summary  Adopting a healthy lifestyle and getting preventive care are important in promoting health and wellness.  Follow your health care provider's instructions about healthy  diet, exercising, and getting tested or screened for diseases.  Follow your health care provider's instructions on monitoring your cholesterol and blood pressure. This information is not intended to replace advice given to you by your health care provider. Make sure you discuss any questions you have with your health care provider. Document Revised: 11/09/2018 Document Reviewed: 11/09/2018 Elsevier Patient Education  2021 Elsevier Inc.  

## 2021-01-22 LAB — HEMOGLOBIN A1C
Hgb A1c MFr Bld: 5.8 % of total Hgb — ABNORMAL HIGH (ref <5.7)
Mean Plasma Glucose: 120 mg/dL
eAG (mmol/L): 6.6 mmol/L

## 2021-01-22 LAB — VITAMIN D 25 HYDROXY (VIT D DEFICIENCY, FRACTURES): Vit D, 25-Hydroxy: 24 ng/mL — ABNORMAL LOW (ref 30–100)

## 2021-06-03 DIAGNOSIS — J309 Allergic rhinitis, unspecified: Secondary | ICD-10-CM | POA: Diagnosis not present

## 2021-06-03 DIAGNOSIS — E039 Hypothyroidism, unspecified: Secondary | ICD-10-CM | POA: Diagnosis not present

## 2021-06-03 DIAGNOSIS — E7800 Pure hypercholesterolemia, unspecified: Secondary | ICD-10-CM | POA: Diagnosis not present

## 2021-07-23 DIAGNOSIS — Z800 Family history of malignant neoplasm of digestive organs: Secondary | ICD-10-CM | POA: Diagnosis not present

## 2021-07-23 DIAGNOSIS — Z85038 Personal history of other malignant neoplasm of large intestine: Secondary | ICD-10-CM | POA: Diagnosis not present

## 2021-07-23 DIAGNOSIS — Z86010 Personal history of colonic polyps: Secondary | ICD-10-CM | POA: Diagnosis not present

## 2021-11-04 DIAGNOSIS — D122 Benign neoplasm of ascending colon: Secondary | ICD-10-CM | POA: Diagnosis not present

## 2021-11-04 DIAGNOSIS — D120 Benign neoplasm of cecum: Secondary | ICD-10-CM | POA: Diagnosis not present

## 2021-11-04 DIAGNOSIS — Z980 Intestinal bypass and anastomosis status: Secondary | ICD-10-CM | POA: Diagnosis not present

## 2021-11-04 DIAGNOSIS — D124 Benign neoplasm of descending colon: Secondary | ICD-10-CM | POA: Diagnosis not present

## 2021-11-04 DIAGNOSIS — Z85048 Personal history of other malignant neoplasm of rectum, rectosigmoid junction, and anus: Secondary | ICD-10-CM | POA: Diagnosis not present

## 2021-11-04 DIAGNOSIS — D128 Benign neoplasm of rectum: Secondary | ICD-10-CM | POA: Diagnosis not present

## 2022-01-14 ENCOUNTER — Other Ambulatory Visit: Payer: Self-pay | Admitting: Nurse Practitioner

## 2022-01-14 DIAGNOSIS — Z1231 Encounter for screening mammogram for malignant neoplasm of breast: Secondary | ICD-10-CM

## 2022-01-20 ENCOUNTER — Other Ambulatory Visit: Payer: Self-pay

## 2022-01-20 ENCOUNTER — Ambulatory Visit (INDEPENDENT_AMBULATORY_CARE_PROVIDER_SITE_OTHER): Payer: BC Managed Care – PPO

## 2022-01-20 ENCOUNTER — Other Ambulatory Visit: Payer: Self-pay | Admitting: Nurse Practitioner

## 2022-01-20 DIAGNOSIS — M8589 Other specified disorders of bone density and structure, multiple sites: Secondary | ICD-10-CM

## 2022-01-20 DIAGNOSIS — Z780 Asymptomatic menopausal state: Secondary | ICD-10-CM

## 2022-01-20 DIAGNOSIS — M8580 Other specified disorders of bone density and structure, unspecified site: Secondary | ICD-10-CM

## 2022-01-21 NOTE — Progress Notes (Signed)
Shari Sexton 07-20-1961 [id number]   History:  61 y.o. G1P0001 presents for annual exam. Postmenopausal - no HRT, no bleeding. Normal pap history. History of leukemia (2013), 2005 right breast cancer (partial mastectomy, radiation, Tamoxifen), and colon cancer (2007), RSO during bowel resection. Hypothyroidism managed by PCP.   Gynecologic History Patient's last menstrual period was 07/20/2006 (exact date).   Contraception/Family planning: post menopausal status Sexually active: Yes  Health Maintenance Last Pap: 10/18/2019. Results were: Normal, 3-year repeat Last mammogram: 01/21/2021. Results were: Normal. Scheduled today Last colonoscopy: 2022. Results were: Polyps, 3-year recall Last Dexa: 01/20/2022. Results were: T-score -1.2 (1.7 in spine) 7.5% / 0.5%  Past medical history, past surgical history, family history and social history were all reviewed and documented in the EPIC chart. Married. Works for [employer]. Daughter is Paramedic at [employer], double Chemical engineer in Chief of Staff and psychology.   ROS:  A ROS was performed and pertinent positives and negatives are included.  Exam:  Vitals:   01/22/22 0840  BP: 124/76  Weight: 152 lb (68.9 kg)  Height: 5\' 2"  (1.575 m)   Body mass index is 27.8 kg/m.  General appearance:  Normal Thyroid:  Symmetrical, normal in size, without palpable masses or nodularity. Respiratory  Auscultation:  Clear without wheezing or rhonchi Cardiovascular  Auscultation:  Regular rate, without rubs, murmurs or gallops  Edema/varicosities:  Not grossly evident Abdominal  Soft,nontender, without masses, guarding or rebound.  Liver/spleen:  No organomegaly noted  Hernia:  None appreciated  Skin  Inspection:  Grossly normal Breasts: Examined lying and sitting.   Right: Without masses, retractions, nipple discharge or axillary adenopathy.   Left: Without masses, retractions, nipple discharge or axillary adenopathy. Genitourinary    Inguinal/mons:  Normal without inguinal adenopathy  External genitalia:  Normal appearing vulva with no masses, tenderness, or lesions  BUS/Urethra/Skene's glands:  Normal  Vagina:  Normal appearing with normal color and discharge, no lesions. Atrophic changes  Cervix:  Normal appearing without discharge or lesions  Uterus:  Normal in size, shape and contour.  Midline and mobile, nontender  Adnexa/parametria:     Rt: Normal in size, without masses or tenderness.   Lt: Normal in size, without masses or tenderness.  Anus and perineum: Normal  Digital rectal exam: Declines  Patient informed chaperone available to be present for breast and pelvic exam. Patient has requested no chaperone to be present. Patient has been advised what will be completed during breast and pelvic exam.   Assessment/Plan:  61 y.o. G1P0001 for annual exam.     Well female exam with routine gynecological exam - Plan: Cytology - PAP( [place]). Education provided on SBEs, importance of preventative screenings, current guidelines, high calcium diet, regular exercise, and multivitamin daily. Labs with PCP, oncology, and through work. They do not check A1C or Vitamin D so she would like these checked here today.   Postmenopausal - no HRT, no bleeding  Osteopenia of multiple sites - T-score -1.7 in spine, 1.2 in right hip. Recommend Vitamin D + Calcium supplement and regular exercise. Will repeat in 2 years per recommendation.   Vitamin D deficiency - Plan: VITAMIN D 25 Hydroxy (Vit-D Deficiency, Fractures). Has not been consistent with supplementation.   Family history of diabetes mellitus in mother - Plan: Hemoglobin A1c  Screening for cervical cancer - Normal Pap history. Pap today.   Screening for breast cancer - Normal mammogram history.  Continue annual screenings.  Normal breast exam today. Mammogram today, no report yet.  Screening for colon cancer - 2022 colonoscopy - 7 polyps. History of colon c cancer  in 2007. Will repeat at 3-year interval per GI's recommendation.   Return in 1 year for annual.     Tamela Gammon DNP, 9:09 AM 01/22/2022

## 2022-01-22 ENCOUNTER — Ambulatory Visit
Admission: RE | Admit: 2022-01-22 | Discharge: 2022-01-22 | Disposition: A | Discharge status: Home / Self Care | Payer: BC Managed Care – PPO | Source: Ambulatory Visit | Attending: Nurse Practitioner | Admitting: Nurse Practitioner

## 2022-01-22 ENCOUNTER — Other Ambulatory Visit: Payer: Self-pay

## 2022-01-22 ENCOUNTER — Encounter: Payer: Self-pay | Admitting: Nurse Practitioner

## 2022-01-22 ENCOUNTER — Ambulatory Visit (INDEPENDENT_AMBULATORY_CARE_PROVIDER_SITE_OTHER): Payer: BC Managed Care – PPO | Admitting: Nurse Practitioner

## 2022-01-22 ENCOUNTER — Ambulatory Visit: Payer: BC Managed Care – PPO | Admitting: Nurse Practitioner

## 2022-01-22 ENCOUNTER — Other Ambulatory Visit (HOSPITAL_COMMUNITY)
Admission: RE | Admit: 2022-01-22 | Discharge: 2022-01-22 | Disposition: A | Discharge status: Home / Self Care | Payer: BC Managed Care – PPO | Source: Ambulatory Visit | Attending: Nurse Practitioner | Admitting: Nurse Practitioner

## 2022-01-22 VITALS — BP 124/76 | Ht 62.0 in | Wt 152.0 lb

## 2022-01-22 DIAGNOSIS — Z833 Family history of diabetes mellitus: Secondary | ICD-10-CM

## 2022-01-22 DIAGNOSIS — M8589 Other specified disorders of bone density and structure, multiple sites: Secondary | ICD-10-CM

## 2022-01-22 DIAGNOSIS — Z780 Asymptomatic menopausal state: Secondary | ICD-10-CM | POA: Diagnosis not present

## 2022-01-22 DIAGNOSIS — E559 Vitamin D deficiency, unspecified: Secondary | ICD-10-CM | POA: Diagnosis not present

## 2022-01-22 DIAGNOSIS — Z01419 Encounter for gynecological examination (general) (routine) without abnormal findings: Secondary | ICD-10-CM | POA: Insufficient documentation

## 2022-01-22 DIAGNOSIS — Z1231 Encounter for screening mammogram for malignant neoplasm of breast: Secondary | ICD-10-CM | POA: Diagnosis not present

## 2022-01-22 LAB — HEMOGLOBIN A1C
Hgb A1c MFr Bld: 5.7 % of total Hgb — ABNORMAL HIGH (ref <5.7)
Mean Plasma Glucose: 117 mg/dL
eAG (mmol/L): 6.5 mmol/L

## 2022-01-22 LAB — VITAMIN D 25 HYDROXY (VIT D DEFICIENCY, FRACTURES): Vit D, 25-Hydroxy: 23 ng/mL — ABNORMAL LOW (ref 30–100)

## 2022-01-23 LAB — CYTOLOGY - PAP: Diagnosis: NEGATIVE

## 2022-01-26 ENCOUNTER — Other Ambulatory Visit: Payer: Self-pay | Admitting: Nurse Practitioner

## 2022-01-26 ENCOUNTER — Telehealth: Payer: Self-pay | Admitting: *Deleted

## 2022-01-26 DIAGNOSIS — E559 Vitamin D deficiency, unspecified: Secondary | ICD-10-CM

## 2022-01-26 MED ORDER — VITAMIN D (ERGOCALCIFEROL) 1.25 MG (50000 UNIT) PO CAPS: 50000.0000 [IU] | ORAL_CAPSULE | ORAL | 0 refills | Status: AC

## 2022-01-26 NOTE — Telephone Encounter (Signed)
Patient called aware of my chart message on 01/22/22.

## 2022-03-02 DIAGNOSIS — R350 Frequency of micturition: Secondary | ICD-10-CM | POA: Diagnosis not present

## 2022-03-02 DIAGNOSIS — N3000 Acute cystitis without hematuria: Secondary | ICD-10-CM | POA: Diagnosis not present

## 2022-03-12 DIAGNOSIS — R7303 Prediabetes: Secondary | ICD-10-CM | POA: Diagnosis not present

## 2022-03-12 DIAGNOSIS — R197 Diarrhea, unspecified: Secondary | ICD-10-CM | POA: Diagnosis not present

## 2022-03-12 DIAGNOSIS — Z980 Intestinal bypass and anastomosis status: Secondary | ICD-10-CM | POA: Diagnosis not present

## 2022-03-12 DIAGNOSIS — Z85038 Personal history of other malignant neoplasm of large intestine: Secondary | ICD-10-CM | POA: Diagnosis not present

## 2022-04-12 ENCOUNTER — Other Ambulatory Visit: Payer: Self-pay | Admitting: Nurse Practitioner

## 2022-04-12 DIAGNOSIS — E559 Vitamin D deficiency, unspecified: Secondary | ICD-10-CM

## 2022-05-22 DIAGNOSIS — E039 Hypothyroidism, unspecified: Secondary | ICD-10-CM | POA: Diagnosis not present

## 2022-05-22 DIAGNOSIS — R350 Frequency of micturition: Secondary | ICD-10-CM | POA: Diagnosis not present

## 2022-05-22 DIAGNOSIS — E559 Vitamin D deficiency, unspecified: Secondary | ICD-10-CM | POA: Diagnosis not present

## 2022-05-22 DIAGNOSIS — R7303 Prediabetes: Secondary | ICD-10-CM | POA: Diagnosis not present

## 2022-07-03 DIAGNOSIS — C9210 Chronic myeloid leukemia, BCR/ABL-positive, not having achieved remission: Secondary | ICD-10-CM | POA: Diagnosis not present

## 2022-07-03 DIAGNOSIS — R350 Frequency of micturition: Secondary | ICD-10-CM | POA: Diagnosis not present

## 2022-07-24 DIAGNOSIS — C9211 Chronic myeloid leukemia, BCR/ABL-positive, in remission: Secondary | ICD-10-CM | POA: Diagnosis not present

## 2022-07-24 DIAGNOSIS — Z9481 Bone marrow transplant status: Secondary | ICD-10-CM | POA: Diagnosis not present

## 2022-08-14 DIAGNOSIS — R072 Precordial pain: Secondary | ICD-10-CM | POA: Diagnosis not present

## 2022-08-14 DIAGNOSIS — C9211 Chronic myeloid leukemia, BCR/ABL-positive, in remission: Secondary | ICD-10-CM | POA: Diagnosis not present

## 2022-08-14 DIAGNOSIS — D127 Benign neoplasm of rectosigmoid junction: Secondary | ICD-10-CM | POA: Diagnosis not present

## 2022-08-14 DIAGNOSIS — D0511 Intraductal carcinoma in situ of right breast: Secondary | ICD-10-CM | POA: Diagnosis not present

## 2022-08-14 DIAGNOSIS — C19 Malignant neoplasm of rectosigmoid junction: Secondary | ICD-10-CM | POA: Diagnosis not present

## 2022-08-14 DIAGNOSIS — Z9481 Bone marrow transplant status: Secondary | ICD-10-CM | POA: Diagnosis not present

## 2022-08-14 DIAGNOSIS — R011 Cardiac murmur, unspecified: Secondary | ICD-10-CM | POA: Diagnosis not present

## 2022-08-21 DIAGNOSIS — Z86010 Personal history of colonic polyps: Secondary | ICD-10-CM | POA: Diagnosis not present

## 2022-08-21 DIAGNOSIS — K529 Noninfective gastroenteritis and colitis, unspecified: Secondary | ICD-10-CM | POA: Diagnosis not present

## 2022-08-21 DIAGNOSIS — Z85048 Personal history of other malignant neoplasm of rectum, rectosigmoid junction, and anus: Secondary | ICD-10-CM | POA: Diagnosis not present

## 2022-08-24 DIAGNOSIS — R9431 Abnormal electrocardiogram [ECG] [EKG]: Secondary | ICD-10-CM | POA: Diagnosis not present

## 2022-08-24 DIAGNOSIS — I071 Rheumatic tricuspid insufficiency: Secondary | ICD-10-CM | POA: Diagnosis not present

## 2022-09-23 DIAGNOSIS — D225 Melanocytic nevi of trunk: Secondary | ICD-10-CM | POA: Diagnosis not present

## 2022-09-23 DIAGNOSIS — C44712 Basal cell carcinoma of skin of right lower limb, including hip: Secondary | ICD-10-CM | POA: Diagnosis not present

## 2022-09-23 DIAGNOSIS — D485 Neoplasm of uncertain behavior of skin: Secondary | ICD-10-CM | POA: Diagnosis not present

## 2022-09-23 DIAGNOSIS — Z1283 Encounter for screening for malignant neoplasm of skin: Secondary | ICD-10-CM | POA: Diagnosis not present

## 2022-09-24 DIAGNOSIS — D2271 Melanocytic nevi of right lower limb, including hip: Secondary | ICD-10-CM | POA: Diagnosis not present

## 2022-09-24 DIAGNOSIS — C44712 Basal cell carcinoma of skin of right lower limb, including hip: Secondary | ICD-10-CM | POA: Diagnosis not present

## 2022-10-27 DIAGNOSIS — L988 Other specified disorders of the skin and subcutaneous tissue: Secondary | ICD-10-CM | POA: Diagnosis not present

## 2022-10-27 DIAGNOSIS — D485 Neoplasm of uncertain behavior of skin: Secondary | ICD-10-CM | POA: Diagnosis not present

## 2022-11-04 DIAGNOSIS — L309 Dermatitis, unspecified: Secondary | ICD-10-CM | POA: Diagnosis not present

## 2022-11-04 DIAGNOSIS — K625 Hemorrhage of anus and rectum: Secondary | ICD-10-CM | POA: Diagnosis not present

## 2022-11-04 DIAGNOSIS — Z85048 Personal history of other malignant neoplasm of rectum, rectosigmoid junction, and anus: Secondary | ICD-10-CM | POA: Diagnosis not present

## 2022-11-04 DIAGNOSIS — K529 Noninfective gastroenteritis and colitis, unspecified: Secondary | ICD-10-CM | POA: Diagnosis not present

## 2022-12-21 DIAGNOSIS — K529 Noninfective gastroenteritis and colitis, unspecified: Secondary | ICD-10-CM | POA: Diagnosis not present

## 2022-12-21 DIAGNOSIS — L309 Dermatitis, unspecified: Secondary | ICD-10-CM | POA: Diagnosis not present

## 2023-01-14 DIAGNOSIS — J069 Acute upper respiratory infection, unspecified: Secondary | ICD-10-CM | POA: Diagnosis not present

## 2023-01-25 ENCOUNTER — Ambulatory Visit (INDEPENDENT_AMBULATORY_CARE_PROVIDER_SITE_OTHER): Payer: BC Managed Care – PPO | Admitting: Nurse Practitioner

## 2023-01-25 ENCOUNTER — Encounter: Payer: Self-pay | Admitting: Nurse Practitioner

## 2023-01-25 ENCOUNTER — Other Ambulatory Visit: Payer: Self-pay | Admitting: Nurse Practitioner

## 2023-01-25 VITALS — BP 122/78 | HR 72 | Ht 62.21 in | Wt 158.0 lb

## 2023-01-25 DIAGNOSIS — Z01419 Encounter for gynecological examination (general) (routine) without abnormal findings: Secondary | ICD-10-CM

## 2023-01-25 DIAGNOSIS — Z780 Asymptomatic menopausal state: Secondary | ICD-10-CM

## 2023-01-25 DIAGNOSIS — L309 Dermatitis, unspecified: Secondary | ICD-10-CM

## 2023-01-25 DIAGNOSIS — E559 Vitamin D deficiency, unspecified: Secondary | ICD-10-CM | POA: Diagnosis not present

## 2023-01-25 DIAGNOSIS — Z1231 Encounter for screening mammogram for malignant neoplasm of breast: Secondary | ICD-10-CM

## 2023-01-25 DIAGNOSIS — R7303 Prediabetes: Secondary | ICD-10-CM | POA: Diagnosis not present

## 2023-01-25 MED ORDER — CLOBETASOL PROPIONATE 0.05 % EX OINT: 1.0000 | TOPICAL_OINTMENT | Freq: Two times a day (BID) | CUTANEOUS | 0 refills | Status: DC

## 2023-01-25 NOTE — Progress Notes (Signed)
Aryka Treece Crossley 02-17-1961 [id number]   History:  62 y.o. G1P0001 presents for annual exam. Postmenopausal - no HRT, no bleeding. Normal pap history. History of leukemia (2013), 2005 right breast cancer (partial mastectomy, radiation, Tamoxifen), and colon cancer (2007), RSO during bowel resection. Hypothyroidism managed by PCP. Complains of burning/itching around anus for months. Likely contact dermatitis per GI. Using wet wipes and Aquafor. She has frequent stools and wipes vigorously.   Gynecologic History Patient's last menstrual period was 07/20/2006 (exact date).   Contraception/Family planning: post menopausal status Sexually active: Yes  Health Maintenance Last Pap: 01/22/2022. Results were: Normal, 3-year repeat Last mammogram: 01/22/2022. Results were: Normal Last colonoscopy: 2022. Results were: Polyps, 3-year recall Last Dexa: 01/20/2022. Results were: T-score -1.2 (1.7 in spine) 7.5% / 0.5%  Past medical history, past surgical history, family history and social history were all reviewed and documented in the EPIC chart. Married. Works for [employer]. Daughter is senior at [employer], double Chemical engineer in Chief of Staff and psychology. Getting married in October.   ROS:  A ROS was performed and pertinent positives and negatives are included.  Exam:  Vitals:   01/25/23 0928  BP: 122/78  Pulse: 72  SpO2: 100%  Weight: 158 lb (71.7 kg)  Height: 5' 2.21" (1.58 m)    Body mass index is 28.71 kg/m.  General appearance:  Normal Thyroid:  Symmetrical, normal in size, without palpable masses or nodularity. Respiratory  Auscultation:  Clear without wheezing or rhonchi Cardiovascular  Auscultation:  Regular rate, without rubs, murmurs or gallops  Edema/varicosities:  Not grossly evident Abdominal  Soft,nontender, without masses, guarding or rebound.  Liver/spleen:  No organomegaly noted  Hernia:  None appreciated  Skin  Inspection:  Grossly normal Breasts:  Examined lying and sitting.   Right: Without masses, retractions, nipple discharge or axillary adenopathy.   Left: Without masses, retractions, nipple discharge or axillary adenopathy. Genitourinary   Inguinal/mons:  Normal without inguinal adenopathy  External genitalia:  Normal appearing vulva with no masses, tenderness, or lesions  BUS/Urethra/Skene's glands:  Normal  Vagina:  Normal appearing with normal color and discharge, no lesions. Atrophic changes  Cervix:  Normal appearing without discharge or lesions  Uterus:  Normal in size, shape and contour.  Midline and mobile, nontender  Adnexa/parametria:     Rt: Normal in size, without masses or tenderness.   Lt: Normal in size, without masses or tenderness.  Anus and perineum: Redness, no lesions or hemorrhoids  Digital rectal exam: Declines  Patient informed chaperone available to be present for breast and pelvic exam. Patient has requested no chaperone to be present. Patient has been advised what will be completed during breast and pelvic exam.   Assessment/Plan:  62 y.o. G1P0001 for annual exam.   Well female exam with routine gynecological exam - Education provided on SBEs, importance of preventative screenings, current guidelines, high calcium diet, regular exercise, and multivitamin daily. Labs with PCP, oncology, and through work. They do not check A1C or Vitamin D so she would like these checked here today.   Postmenopausal - no HRT, no bleeding  Osteopenia of multiple sites - T-score -1.7 in spine, 1.2 in right hip. Recommend Vitamin D + Calcium supplement and regular exercise. Will repeat in 2 years per recommendation.   Vitamin D deficiency - Plan: VITAMIN D 25 Hydroxy (Vit-D Deficiency, Fractures)  Pre-diabetes - Plan: Hemoglobin A1c. A1C 6.0 last year with recommendations to work on diet and exercise.   Dermatitis - Plan: clobetasol ointment (TEMOVATE)  0.05 % BID x 7 days. After this may use sensitive wet wipes but  should pat dry. Then apply witch hazel and aquafor.   Screening for cervical cancer - Normal Pap history. Will repeat at 3-year interval per guidelines.   Screening for breast cancer - Normal mammogram history.  Continue annual screenings.  Normal breast exam today.   Screening for colon cancer - 2022 colonoscopy - 7 polyps. History of colon cancer in 2007. Will repeat at 3-year interval per GI's recommendation.   Return in 1 year for annual.     Tamela Gammon DNP, 9:48 AM 01/25/2023

## 2023-01-26 LAB — HEMOGLOBIN A1C
Hgb A1c MFr Bld: 6.2 % of total Hgb — ABNORMAL HIGH (ref <5.7)
Mean Plasma Glucose: 131 mg/dL
eAG (mmol/L): 7.3 mmol/L

## 2023-01-26 LAB — VITAMIN D 25 HYDROXY (VIT D DEFICIENCY, FRACTURES): Vit D, 25-Hydroxy: 32 ng/mL (ref 30–100)

## 2023-03-09 ENCOUNTER — Ambulatory Visit
Admission: RE | Admit: 2023-03-09 | Discharge: 2023-03-09 | Disposition: A | Discharge status: Home / Self Care | Payer: BC Managed Care – PPO | Source: Ambulatory Visit | Attending: Nurse Practitioner | Admitting: Nurse Practitioner

## 2023-03-09 DIAGNOSIS — Z1231 Encounter for screening mammogram for malignant neoplasm of breast: Secondary | ICD-10-CM | POA: Diagnosis not present

## 2023-03-12 DIAGNOSIS — R42 Dizziness and giddiness: Secondary | ICD-10-CM | POA: Diagnosis not present

## 2023-05-25 ENCOUNTER — Other Ambulatory Visit: Payer: Self-pay | Admitting: Nurse Practitioner

## 2023-05-25 DIAGNOSIS — L309 Dermatitis, unspecified: Secondary | ICD-10-CM

## 2023-05-25 NOTE — Telephone Encounter (Signed)
Med refill request: Clobetasol Last AEX: 01/25/23 Next AEX: not scheduled Last MMG (if hormonal med) 03/09/23 Refill authorized: Please Advise, #[phone], 0 RF

## 2023-06-17 DIAGNOSIS — R7303 Prediabetes: Secondary | ICD-10-CM | POA: Diagnosis not present

## 2023-06-17 DIAGNOSIS — E7800 Pure hypercholesterolemia, unspecified: Secondary | ICD-10-CM | POA: Diagnosis not present

## 2023-06-17 DIAGNOSIS — E039 Hypothyroidism, unspecified: Secondary | ICD-10-CM | POA: Diagnosis not present

## 2023-07-07 DIAGNOSIS — M79672 Pain in left foot: Secondary | ICD-10-CM | POA: Diagnosis not present

## 2023-07-07 DIAGNOSIS — M79671 Pain in right foot: Secondary | ICD-10-CM | POA: Diagnosis not present

## 2023-10-26 DIAGNOSIS — R197 Diarrhea, unspecified: Secondary | ICD-10-CM | POA: Diagnosis not present

## 2023-10-26 DIAGNOSIS — R140 Abdominal distension (gaseous): Secondary | ICD-10-CM | POA: Diagnosis not present

## 2023-12-13 DIAGNOSIS — R197 Diarrhea, unspecified: Secondary | ICD-10-CM | POA: Diagnosis not present

## 2024-01-25 ENCOUNTER — Other Ambulatory Visit: Payer: Self-pay | Admitting: Nurse Practitioner

## 2024-01-25 DIAGNOSIS — Z1231 Encounter for screening mammogram for malignant neoplasm of breast: Secondary | ICD-10-CM

## 2024-01-27 ENCOUNTER — Encounter: Payer: Self-pay | Admitting: Nurse Practitioner

## 2024-01-27 ENCOUNTER — Ambulatory Visit (INDEPENDENT_AMBULATORY_CARE_PROVIDER_SITE_OTHER): Payer: BC Managed Care – PPO | Admitting: Nurse Practitioner

## 2024-01-27 VITALS — BP 126/84 | HR 76 | Ht 63.0 in | Wt 162.0 lb

## 2024-01-27 DIAGNOSIS — Z01419 Encounter for gynecological examination (general) (routine) without abnormal findings: Secondary | ICD-10-CM

## 2024-01-27 DIAGNOSIS — Z780 Asymptomatic menopausal state: Secondary | ICD-10-CM

## 2024-01-27 DIAGNOSIS — M8589 Other specified disorders of bone density and structure, multiple sites: Secondary | ICD-10-CM | POA: Diagnosis not present

## 2024-01-27 DIAGNOSIS — R7303 Prediabetes: Secondary | ICD-10-CM | POA: Diagnosis not present

## 2024-01-27 DIAGNOSIS — E559 Vitamin D deficiency, unspecified: Secondary | ICD-10-CM

## 2024-01-27 NOTE — Progress Notes (Signed)
 Shari Sexton Aug 16, 1961 [id number]   History:  63 y.o. G1P0001 presents for annual exam. Postmenopausal - no HRT, no bleeding. Normal pap history. History of leukemia (2013), 2005 right breast cancer (partial mastectomy, radiation, Tamoxifen), and colon cancer (2007), RSO during bowel resection. Hypothyroidism managed by PCP. H/O prediabetes. Requesting A1c and Vit D today.   Gynecologic History Patient's last menstrual period was 07/20/2006 (exact date).   Contraception/Family planning: post menopausal status Sexually active: Yes  Health Maintenance Last Pap: 01/22/2022. Results were: Normal, 3-year repeat Last mammogram: 03/09/2023. Results were: Normal Last colonoscopy: 2022. Results were: Polyps, 3-year recall Last Dexa: 01/20/2022. Results were: T-score -1.2 (1.7 in spine) 7.5% / 0.5% Exercising: Yes. The patient has a physically strenuous job, but has no regular exercise apart from work Smoker: no   Past medical history, past surgical history, family history and social history were all reviewed and documented in the EPIC chart. Married. Works for [employer]. Daughter graduated from [employer], now in grad school at [employer] for psych, got married October 2024.  ROS:  A ROS was performed and pertinent positives and negatives are included.  Exam:  Vitals:   01/27/24 0851  BP: 126/84  Pulse: 76  SpO2: 100%  Weight: 162 lb (73.5 kg)  Height: 5\' 3"  (1.6 m)     Body mass index is 28.7 kg/m.  General appearance:  Normal Thyroid:  Symmetrical, normal in size, without palpable masses or nodularity. Respiratory  Auscultation:  Clear without wheezing or rhonchi Cardiovascular  Auscultation:  Regular rate, without rubs, murmurs or gallops  Edema/varicosities:  Not grossly evident Abdominal  Soft,nontender, without masses, guarding or rebound.  Liver/spleen:  No organomegaly noted  Hernia:  None appreciated  Skin  Inspection:  Grossly normal Breasts: Examined  lying and sitting.   Right: Without masses, retractions, nipple discharge or axillary adenopathy.   Left: Without masses, retractions, nipple discharge or axillary adenopathy. Pelvic: External genitalia:  no lesions              Urethra:  normal appearing urethra with no masses, tenderness or lesions              Bartholins and Skenes: normal                 Vagina: normal appearing vagina with normal color and discharge, no lesions              Cervix: no lesions Bimanual Exam:  Uterus:  no masses or tenderness              Adnexa: no mass, fullness, tenderness              Rectovaginal: Deferred              Anus:  normal, no lesions  Patient informed chaperone available to be present for breast and pelvic exam. Patient has requested no chaperone to be present. Patient has been advised what will be completed during breast and pelvic exam.   Assessment/Plan:  63 y.o. G1P0001 for annual exam.   Well female exam with routine gynecological exam - Education provided on SBEs, importance of preventative screenings, current guidelines, high calcium diet, regular exercise, and multivitamin daily. Labs with PCP, oncology, and through work.   Postmenopausal - no HRT, no bleeding  Osteopenia of multiple sites - Plan: DG Bone Density. T-score -1.7 in spine, 1.2 in right hip. Recommend Vitamin D + Calcium supplement and regular exercise.   Prediabetes - Plan: Hemoglobin A1c.  Will follow up with PCP for management.   Vitamin D deficiency - Plan: VITAMIN D 25 Hydroxy (Vit-D Deficiency, Fractures)  Screening for cervical cancer - Normal Pap history. Will repeat at 3-year interval per guidelines.   Screening for breast cancer - Normal mammogram history.  Continue annual screenings.  Normal breast exam today.   Screening for colon cancer - 2022 colonoscopy - 7 polyps. History of colon cancer in 2007. Colonoscopy scheduled in December.   Return in about 1 year (around 01/26/2025) for  Annual.    Olivia Mackie DNP, 9:23 AM 01/27/2024

## 2024-01-28 LAB — HEMOGLOBIN A1C
Hgb A1c MFr Bld: 5.9 %{Hb} — ABNORMAL HIGH (ref <5.7)
Mean Plasma Glucose: 123 mg/dL
eAG (mmol/L): 6.8 mmol/L

## 2024-01-28 LAB — VITAMIN D 25 HYDROXY (VIT D DEFICIENCY, FRACTURES): Vit D, 25-Hydroxy: 21 ng/mL — ABNORMAL LOW (ref 30–100)

## 2024-01-31 ENCOUNTER — Other Ambulatory Visit: Payer: Self-pay | Admitting: Nurse Practitioner

## 2024-01-31 ENCOUNTER — Encounter: Payer: Self-pay | Admitting: Nurse Practitioner

## 2024-01-31 DIAGNOSIS — E559 Vitamin D deficiency, unspecified: Secondary | ICD-10-CM

## 2024-01-31 MED ORDER — VITAMIN D (ERGOCALCIFEROL) 1.25 MG (50000 UNIT) PO CAPS: 50000.0000 [IU] | ORAL_CAPSULE | ORAL | 0 refills | Status: AC

## 2024-02-21 ENCOUNTER — Ambulatory Visit (HOSPITAL_BASED_OUTPATIENT_CLINIC_OR_DEPARTMENT_OTHER)
Admission: RE | Admit: 2024-02-21 | Discharge: 2024-02-21 | Disposition: A | Discharge status: Home / Self Care | Source: Ambulatory Visit | Attending: Nurse Practitioner | Admitting: Nurse Practitioner

## 2024-02-21 DIAGNOSIS — M8589 Other specified disorders of bone density and structure, multiple sites: Secondary | ICD-10-CM | POA: Insufficient documentation

## 2024-02-21 DIAGNOSIS — Z780 Asymptomatic menopausal state: Secondary | ICD-10-CM | POA: Diagnosis not present

## 2024-02-21 DIAGNOSIS — M8588 Other specified disorders of bone density and structure, other site: Secondary | ICD-10-CM | POA: Diagnosis not present

## 2024-02-29 ENCOUNTER — Encounter: Payer: Self-pay | Admitting: Nurse Practitioner

## 2024-03-09 ENCOUNTER — Other Ambulatory Visit: Payer: Self-pay | Admitting: Physician Assistant

## 2024-03-09 ENCOUNTER — Ambulatory Visit
Admission: RE | Admit: 2024-03-09 | Discharge: 2024-03-09 | Disposition: A | Discharge status: Home / Self Care | Source: Ambulatory Visit | Attending: Physician Assistant | Admitting: Physician Assistant

## 2024-03-09 ENCOUNTER — Ambulatory Visit
Admission: RE | Admit: 2024-03-09 | Discharge: 2024-03-09 | Disposition: A | Discharge status: Home / Self Care | Payer: BC Managed Care – PPO | Source: Ambulatory Visit | Attending: Nurse Practitioner

## 2024-03-09 DIAGNOSIS — M79659 Pain in unspecified thigh: Secondary | ICD-10-CM | POA: Diagnosis not present

## 2024-03-09 DIAGNOSIS — Z1231 Encounter for screening mammogram for malignant neoplasm of breast: Secondary | ICD-10-CM

## 2024-03-21 DIAGNOSIS — Z1389 Encounter for screening for other disorder: Secondary | ICD-10-CM | POA: Diagnosis not present

## 2024-03-22 ENCOUNTER — Other Ambulatory Visit: Payer: Self-pay | Admitting: Physician Assistant

## 2024-03-22 DIAGNOSIS — M79659 Pain in unspecified thigh: Secondary | ICD-10-CM

## 2024-04-06 ENCOUNTER — Ambulatory Visit
Admission: RE | Admit: 2024-04-06 | Discharge: 2024-04-06 | Disposition: A | Discharge status: Home / Self Care | Source: Ambulatory Visit | Attending: Physician Assistant | Admitting: Physician Assistant

## 2024-04-06 DIAGNOSIS — M79659 Pain in unspecified thigh: Secondary | ICD-10-CM

## 2024-04-06 MED ORDER — GADOPICLENOL 0.5 MMOL/ML IV SOLN
7.0000 mL | Freq: Once | INTRAVENOUS | Status: AC | PRN
  Administered 2024-04-06: 7 mL via INTRAVENOUS

## 2024-04-18 ENCOUNTER — Other Ambulatory Visit: Payer: Self-pay | Admitting: Nurse Practitioner

## 2024-04-18 DIAGNOSIS — E559 Vitamin D deficiency, unspecified: Secondary | ICD-10-CM

## 2024-04-18 NOTE — Telephone Encounter (Signed)
 FYI. Pt LVM in med refill line stating that she has two doses of Vit D left. To take tonight and then in another week. She is aware that once she has completed, will call to schedule lab appt for recheck.

## 2024-04-18 NOTE — Telephone Encounter (Signed)
 Med refill request: Vitamin D  50000 IU Last AEX: 01/27/24 Next AEX: 01/29/25 Last [facility] (if hormonal med) n/a VM left for patient TCB.  Last filled 01/31/24.  Patient should have taken for 12 weeks and then had follow up lab appointment to recheck vitamin D .

## 2024-05-15 ENCOUNTER — Other Ambulatory Visit

## 2024-05-15 DIAGNOSIS — E559 Vitamin D deficiency, unspecified: Secondary | ICD-10-CM | POA: Diagnosis not present

## 2024-05-15 DIAGNOSIS — K529 Noninfective gastroenteritis and colitis, unspecified: Secondary | ICD-10-CM | POA: Diagnosis not present

## 2024-05-16 LAB — VITAMIN D 25 HYDROXY (VIT D DEFICIENCY, FRACTURES): Vit D, 25-Hydroxy: 63 ng/mL (ref 30–100)

## 2024-05-17 ENCOUNTER — Ambulatory Visit: Payer: Self-pay | Admitting: Nurse Practitioner

## 2024-05-18 DIAGNOSIS — K529 Noninfective gastroenteritis and colitis, unspecified: Secondary | ICD-10-CM | POA: Diagnosis not present

## 2024-06-26 DIAGNOSIS — R7303 Prediabetes: Secondary | ICD-10-CM | POA: Diagnosis not present

## 2024-06-26 DIAGNOSIS — E559 Vitamin D deficiency, unspecified: Secondary | ICD-10-CM | POA: Diagnosis not present

## 2024-06-26 DIAGNOSIS — Z23 Encounter for immunization: Secondary | ICD-10-CM | POA: Diagnosis not present

## 2024-06-26 DIAGNOSIS — M25439 Effusion, unspecified wrist: Secondary | ICD-10-CM | POA: Diagnosis not present

## 2024-06-26 DIAGNOSIS — C9210 Chronic myeloid leukemia, BCR/ABL-positive, not having achieved remission: Secondary | ICD-10-CM | POA: Diagnosis not present

## 2024-06-26 DIAGNOSIS — E7800 Pure hypercholesterolemia, unspecified: Secondary | ICD-10-CM | POA: Diagnosis not present

## 2024-06-26 DIAGNOSIS — E039 Hypothyroidism, unspecified: Secondary | ICD-10-CM | POA: Diagnosis not present

## 2024-07-21 DIAGNOSIS — Z08 Encounter for follow-up examination after completed treatment for malignant neoplasm: Secondary | ICD-10-CM | POA: Diagnosis not present

## 2024-07-21 DIAGNOSIS — L814 Other melanin hyperpigmentation: Secondary | ICD-10-CM | POA: Diagnosis not present

## 2024-07-21 DIAGNOSIS — D485 Neoplasm of uncertain behavior of skin: Secondary | ICD-10-CM | POA: Diagnosis not present

## 2024-07-21 DIAGNOSIS — Z85828 Personal history of other malignant neoplasm of skin: Secondary | ICD-10-CM | POA: Diagnosis not present

## 2024-07-21 DIAGNOSIS — L905 Scar conditions and fibrosis of skin: Secondary | ICD-10-CM | POA: Diagnosis not present

## 2024-07-21 DIAGNOSIS — D225 Melanocytic nevi of trunk: Secondary | ICD-10-CM | POA: Diagnosis not present

## 2024-07-21 DIAGNOSIS — C9211 Chronic myeloid leukemia, BCR/ABL-positive, in remission: Secondary | ICD-10-CM | POA: Diagnosis not present

## 2024-07-21 DIAGNOSIS — Z9481 Bone marrow transplant status: Secondary | ICD-10-CM | POA: Diagnosis not present

## 2024-07-21 DIAGNOSIS — Z1283 Encounter for screening for malignant neoplasm of skin: Secondary | ICD-10-CM | POA: Diagnosis not present

## 2024-08-31 DIAGNOSIS — M79651 Pain in right thigh: Secondary | ICD-10-CM | POA: Diagnosis not present

## 2024-08-31 DIAGNOSIS — M79641 Pain in right hand: Secondary | ICD-10-CM | POA: Diagnosis not present

## 2024-08-31 DIAGNOSIS — M25552 Pain in left hip: Secondary | ICD-10-CM | POA: Diagnosis not present

## 2024-08-31 DIAGNOSIS — M79643 Pain in unspecified hand: Secondary | ICD-10-CM | POA: Diagnosis not present

## 2024-08-31 DIAGNOSIS — M79659 Pain in unspecified thigh: Secondary | ICD-10-CM | POA: Diagnosis not present

## 2024-08-31 DIAGNOSIS — M2550 Pain in unspecified joint: Secondary | ICD-10-CM | POA: Diagnosis not present

## 2024-08-31 DIAGNOSIS — M1990 Unspecified osteoarthritis, unspecified site: Secondary | ICD-10-CM | POA: Diagnosis not present

## 2024-08-31 DIAGNOSIS — R899 Unspecified abnormal finding in specimens from other organs, systems and tissues: Secondary | ICD-10-CM | POA: Diagnosis not present

## 2024-08-31 DIAGNOSIS — M25551 Pain in right hip: Secondary | ICD-10-CM | POA: Diagnosis not present

## 2024-08-31 DIAGNOSIS — M79642 Pain in left hand: Secondary | ICD-10-CM | POA: Diagnosis not present

## 2024-09-13 DIAGNOSIS — M79642 Pain in left hand: Secondary | ICD-10-CM | POA: Diagnosis not present

## 2024-09-13 DIAGNOSIS — M79641 Pain in right hand: Secondary | ICD-10-CM | POA: Diagnosis not present

## 2024-09-13 DIAGNOSIS — M65312 Trigger thumb, left thumb: Secondary | ICD-10-CM | POA: Diagnosis not present

## 2024-09-13 DIAGNOSIS — M19031 Primary osteoarthritis, right wrist: Secondary | ICD-10-CM | POA: Diagnosis not present

## 2024-10-10 DIAGNOSIS — M5416 Radiculopathy, lumbar region: Secondary | ICD-10-CM | POA: Diagnosis not present

## 2024-10-11 DIAGNOSIS — M19031 Primary osteoarthritis, right wrist: Secondary | ICD-10-CM | POA: Diagnosis not present

## 2024-10-11 DIAGNOSIS — M79641 Pain in right hand: Secondary | ICD-10-CM | POA: Diagnosis not present

## 2024-10-11 DIAGNOSIS — R2231 Localized swelling, mass and lump, right upper limb: Secondary | ICD-10-CM | POA: Diagnosis not present

## 2024-10-11 DIAGNOSIS — M65312 Trigger thumb, left thumb: Secondary | ICD-10-CM | POA: Diagnosis not present

## 2024-10-12 DIAGNOSIS — M3500 Sicca syndrome, unspecified: Secondary | ICD-10-CM | POA: Diagnosis not present

## 2024-10-12 DIAGNOSIS — M1990 Unspecified osteoarthritis, unspecified site: Secondary | ICD-10-CM | POA: Diagnosis not present

## 2024-10-12 DIAGNOSIS — C9210 Chronic myeloid leukemia, BCR/ABL-positive, not having achieved remission: Secondary | ICD-10-CM | POA: Diagnosis not present

## 2024-10-12 DIAGNOSIS — M79659 Pain in unspecified thigh: Secondary | ICD-10-CM | POA: Diagnosis not present

## 2024-10-14 DIAGNOSIS — S63591A Other specified sprain of right wrist, initial encounter: Secondary | ICD-10-CM | POA: Diagnosis not present

## 2025-01-29 ENCOUNTER — Ambulatory Visit: Payer: BC Managed Care – PPO | Admitting: Nurse Practitioner

## 2025-01-31 ENCOUNTER — Ambulatory Visit: Admitting: Nurse Practitioner
